# Patient Record
Sex: Male | Born: 1935 | Race: White | Hispanic: No | Marital: Married | Smoking: Former smoker
Health system: Southern US, Community
[De-identification: ages and names within clinical notes are randomized; demographics above are authoritative.]

## PROBLEM LIST (undated history)

## (undated) DIAGNOSIS — C4490 Unspecified malignant neoplasm of skin, unspecified: Secondary | ICD-10-CM

## (undated) DIAGNOSIS — J449 Chronic obstructive pulmonary disease, unspecified: Secondary | ICD-10-CM

## (undated) DIAGNOSIS — J4489 Other specified chronic obstructive pulmonary disease: Secondary | ICD-10-CM

## (undated) DIAGNOSIS — E785 Hyperlipidemia, unspecified: Secondary | ICD-10-CM

## (undated) DIAGNOSIS — I2510 Atherosclerotic heart disease of native coronary artery without angina pectoris: Secondary | ICD-10-CM

## (undated) DIAGNOSIS — I519 Heart disease, unspecified: Secondary | ICD-10-CM

## (undated) DIAGNOSIS — I714 Abdominal aortic aneurysm, without rupture: Secondary | ICD-10-CM

## (undated) DIAGNOSIS — H3530 Unspecified macular degeneration: Secondary | ICD-10-CM

## (undated) DIAGNOSIS — F0280 Dementia in other diseases classified elsewhere without behavioral disturbance: Secondary | ICD-10-CM

## (undated) DIAGNOSIS — E119 Type 2 diabetes mellitus without complications: Secondary | ICD-10-CM

## (undated) DIAGNOSIS — G309 Alzheimer's disease, unspecified: Secondary | ICD-10-CM

## (undated) DIAGNOSIS — N400 Enlarged prostate without lower urinary tract symptoms: Secondary | ICD-10-CM

## (undated) DIAGNOSIS — I10 Essential (primary) hypertension: Secondary | ICD-10-CM

## (undated) HISTORY — DX: Other specified chronic obstructive pulmonary disease: J44.89

## (undated) HISTORY — DX: Dementia in other diseases classified elsewhere, unspecified severity, without behavioral disturbance, psychotic disturbance, mood disturbance, and anxiety: F02.80

## (undated) HISTORY — PX: ABDOMINAL AORTIC ANEURYSM REPAIR: SUR1152

## (undated) HISTORY — DX: Chronic obstructive pulmonary disease, unspecified: J44.9

## (undated) HISTORY — DX: Essential (primary) hypertension: I10

## (undated) HISTORY — DX: Abdominal aortic aneurysm, without rupture: I71.4

## (undated) HISTORY — DX: Unspecified malignant neoplasm of skin, unspecified: C44.90

## (undated) HISTORY — DX: Hyperlipidemia, unspecified: E78.5

## (undated) HISTORY — DX: Heart disease, unspecified: I51.9

## (undated) HISTORY — DX: Alzheimer's disease, unspecified: G30.9

## (undated) HISTORY — PX: OTHER SURGICAL HISTORY: SHX169

## (undated) HISTORY — DX: Atherosclerotic heart disease of native coronary artery without angina pectoris: I25.10

## (undated) HISTORY — DX: Type 2 diabetes mellitus without complications: E11.9

## (undated) HISTORY — DX: Benign prostatic hyperplasia without lower urinary tract symptoms: N40.0

---

## 2011-06-16 LAB — LIPID PANEL
LDL Cholesterol: 87 mg/dL
Triglycerides: 96 mg/dL (ref 40–160)

## 2011-06-16 LAB — CBC AND DIFFERENTIAL
Platelets: 141 10*3/uL — AB (ref 150–399)
WBC: 7.1 10^3/mL

## 2011-06-16 LAB — TSH: TSH: 2.1 u[IU]/mL (ref <=5.90)

## 2012-04-23 LAB — BASIC METABOLIC PANEL
BUN: 40 mg/dL — AB (ref 4–21)
Creatinine: 1.2 mg/dL (ref <=1.3)

## 2012-04-23 LAB — HEMOGLOBIN A1C: Hgb A1c MFr Bld: 7.3 % — AB (ref 4.0–6.0)

## 2012-04-23 LAB — HEPATIC FUNCTION PANEL: AST: 22 U/L (ref 14–40)

## 2012-05-10 ENCOUNTER — Encounter: Payer: Self-pay | Admitting: Family Medicine

## 2012-05-10 ENCOUNTER — Ambulatory Visit (INDEPENDENT_AMBULATORY_CARE_PROVIDER_SITE_OTHER): Payer: Medicare Other | Admitting: Family Medicine

## 2012-05-10 VITALS — BP 140/70 | Ht 66.0 in | Wt 130.0 lb

## 2012-05-10 DIAGNOSIS — I2510 Atherosclerotic heart disease of native coronary artery without angina pectoris: Secondary | ICD-10-CM

## 2012-05-10 DIAGNOSIS — N400 Enlarged prostate without lower urinary tract symptoms: Secondary | ICD-10-CM

## 2012-05-10 DIAGNOSIS — I714 Abdominal aortic aneurysm, without rupture, unspecified: Secondary | ICD-10-CM | POA: Insufficient documentation

## 2012-05-10 DIAGNOSIS — E785 Hyperlipidemia, unspecified: Secondary | ICD-10-CM

## 2012-05-10 DIAGNOSIS — J449 Chronic obstructive pulmonary disease, unspecified: Secondary | ICD-10-CM

## 2012-05-10 DIAGNOSIS — E119 Type 2 diabetes mellitus without complications: Secondary | ICD-10-CM

## 2012-05-10 DIAGNOSIS — R413 Other amnesia: Secondary | ICD-10-CM

## 2012-05-10 DIAGNOSIS — H543 Unqualified visual loss, both eyes: Secondary | ICD-10-CM

## 2012-05-10 DIAGNOSIS — R454 Irritability and anger: Secondary | ICD-10-CM

## 2012-05-10 DIAGNOSIS — I10 Essential (primary) hypertension: Secondary | ICD-10-CM

## 2012-05-10 HISTORY — DX: Abdominal aortic aneurysm, without rupture, unspecified: I71.40

## 2012-05-10 HISTORY — DX: Benign prostatic hyperplasia without lower urinary tract symptoms: N40.0

## 2012-05-10 HISTORY — DX: Essential (primary) hypertension: I10

## 2012-05-10 HISTORY — DX: Hyperlipidemia, unspecified: E78.5

## 2012-05-10 HISTORY — DX: Atherosclerotic heart disease of native coronary artery without angina pectoris: I25.10

## 2012-05-10 HISTORY — DX: Abdominal aortic aneurysm, without rupture: I71.4

## 2012-05-10 HISTORY — DX: Type 2 diabetes mellitus without complications: E11.9

## 2012-05-10 HISTORY — DX: Chronic obstructive pulmonary disease, unspecified: J44.9

## 2012-05-10 NOTE — Progress Notes (Addendum)
CC: Mark Cooley is a 78 y.o. male is here for Establish Care   Subjective: HPI:  Patient presents to establish care, accompanied by his daughter Mark Cooley.  The daughter states the main concern for this visit involved personality changes over the past weeks. This has included irritability, forgetfulness, and short-term memory loss. There has been no social or lifestyle or pharmaceutical changes that proceeded this. The patient and the daughter believe that otherwise the patient has been in his regular state of health.  The patient lives with his wife and reports independence in all activities of daily living. He manages his own medications and will not let the daughter managed any medications despite daughter's concern that he might not be taking medications as prescribed. The 2 of them deny any recent motor or sensory disturbances. He has a history of a "stroke in the eyes" but cannot give more information other than this and that he is legally blind but can still see shadows. He takes a baby aspirin a day.  History of type 2 diabetes last A1c was 7.3 in October. He taking glyburide and metformin and has no outside blood sugars to report. There has been no polyphagia polydipsia nor polyuria.  History of COPD for which she is prescribed Advair however the daughter suspicious that he might not be taking. He is prescribed oxygen but uses it rarely citing concerns that he may develop a dependence for it. He denies cough, shortness of breath, orthopnea, wheezing, nor peripheral edema.   History of coronary artery disease, he has an upcoming appointment with his cardiologist next week. He is on a beta blocker, he's on aspirin, and statin. He was catheterized sometime in the last one to 2 years but there was no intervention. He reports using nitroglycerin 7 months ago, but the daughter believes he takes it more frequently. He refuses to carry nitroglycerin glycerin in his pocket consecutive in his  bathroom. He denies chest pain exertion, chest pain at rest, diaphoresis, jaw pain.    daughter is concerned that many negative review of systems responses may not be truthful.   Review Of Systems Outlined In HPI  Past Medical History  Diagnosis Date  . Heart disease   . Hypertension   . Hyperlipidemia   . Diabetes   . Asthma with COPD   . CAD (coronary artery disease)   . Skin cancer   . Hyperlipidemia LDL goal < 70 05/10/2012  . Essential hypertension 05/10/2012  . BPH (benign prostatic hyperplasia) 05/10/2012  . Coronary artery disease 05/10/2012  . COPD (chronic obstructive pulmonary disease) 05/10/2012  . AAA (abdominal aortic aneurysm) 05/10/2012  . Type 2 diabetes mellitus 05/10/2012     History reviewed. No pertinent family history.   History  Substance Use Topics  . Smoking status: Former Smoker    Types: Cigarettes  . Smokeless tobacco: Not on file     Comment: quit 25 years ago  . Alcohol Use: No     Objective: Filed Vitals:   05/10/12 1000  BP: 140/70    General: Alert and Oriented, No Acute Distress HEENT: Pupils equal, round, reactive to light. Conjunctivae clear. Moist mucous membranes, pharynx without inflammation nor lesions.  Neck supple without palpable lymphadenopathy nor abnormal masses. Lungs: Clear to auscultation bilaterally, no wheezing/ronchi/rales.  distant breath sounds  Cardiac: Regular rate and rhythm. Normal S1/S2.  No murmurs, rubs, nor gallops.   no carotid bruits  Abdomen: soft nontender to palpation Extremities: No peripheral edema.  Strong peripheral  pulses.  Neuro: CN II-XII grossly intact, full strength/rom of all four extremities, C5/L4/S1 DTRs 2/4 bilaterally, gait normal, rapid alternating movements normal Mental Status: No depression, anxiety, nor agitation. short-term memory loss however goes into detail events that occurred years ago which are confirmed to be correct by his daughter.  Skin: Warm and dry.  Assessment &  Plan: Jaquane was seen today for establish care.  Diagnoses and associated orders for this visit:  Memory loss - MR Brain Wo Contrast; Future  Type 2 diabetes mellitus  Coronary artery disease  Essential hypertension  Irritability   daughter is concerned that his recent decline in memory may be manifestation of a stroke, will get an MRI to see if this is the case to determine if Plavix or similar antiplatelet medications warranted. They will be sending his old records to me for review to determine if he has a recent TSH, B12, lipid and renal function labs. Have asked him to carry nitroglycerin instead of leaving it at home.  Daughter tells me that there's a nurse from [place] [facility] and will be visiting his house sometime this week, it sounds like this may be a home safety eval which I think is a great idea. I encouraged the patient to allow his daughter to manage his medications and to use oxygen as prescribed, however did point out that his oxygen saturation at rest today was adequate.  Diabetes appears to be well-controlled, if blood pressure remains elevated a subsequent visit we'll consider adjusting his beta blocker.  Return in about 4 weeks (around 06/07/2012).

## 2012-05-11 ENCOUNTER — Telehealth: Payer: Self-pay | Admitting: *Deleted

## 2012-05-11 NOTE — Telephone Encounter (Signed)
PA obtained for MRi Brain w/o contrast. Auth # is [id number]. Good through 06/25/2012. Carolyn at [facility] informed.

## 2012-05-12 ENCOUNTER — Other Ambulatory Visit: Payer: Self-pay | Admitting: Family Medicine

## 2012-05-12 DIAGNOSIS — Z1389 Encounter for screening for other disorder: Secondary | ICD-10-CM

## 2012-05-15 ENCOUNTER — Ambulatory Visit (HOSPITAL_BASED_OUTPATIENT_CLINIC_OR_DEPARTMENT_OTHER)
Admission: RE | Admit: 2012-05-15 | Discharge: 2012-05-15 | Disposition: A | Discharge status: Home / Self Care | Payer: Medicare Other | Source: Ambulatory Visit | Attending: Family Medicine | Admitting: Family Medicine

## 2012-05-15 DIAGNOSIS — R413 Other amnesia: Secondary | ICD-10-CM | POA: Insufficient documentation

## 2012-05-15 DIAGNOSIS — Z1389 Encounter for screening for other disorder: Secondary | ICD-10-CM

## 2012-05-15 DIAGNOSIS — H539 Unspecified visual disturbance: Secondary | ICD-10-CM | POA: Insufficient documentation

## 2012-05-15 DIAGNOSIS — F29 Unspecified psychosis not due to a substance or known physiological condition: Secondary | ICD-10-CM | POA: Insufficient documentation

## 2012-05-17 ENCOUNTER — Telehealth: Payer: Self-pay | Admitting: Family Medicine

## 2012-05-17 DIAGNOSIS — I6509 Occlusion and stenosis of unspecified vertebral artery: Secondary | ICD-10-CM | POA: Insufficient documentation

## 2012-05-17 NOTE — Telephone Encounter (Signed)
Called and spoke with sharon, the daughter and she is aware of results

## 2012-05-17 NOTE — Telephone Encounter (Signed)
Mark Cooley, Will you please let the Sindelar family know that the MRI did not show any evidence of a stroke, this is good news.  The radiologist believes one of the arteries that supplies the left part of the brain might have some narrowing.  If this is true it could increase Mark Cooley's risk of a future stroke, I've placed an order for a vascular ultrasound to see if this will give [place] a better picture.

## 2012-05-20 ENCOUNTER — Telehealth: Payer: Self-pay

## 2012-05-20 NOTE — Telephone Encounter (Signed)
After the ultrasound is scheduled please call his daughter with all information.

## 2012-06-07 ENCOUNTER — Ambulatory Visit: Payer: Medicare Other | Admitting: Family Medicine

## 2012-06-10 ENCOUNTER — Ambulatory Visit: Payer: Medicare Other | Admitting: Family Medicine

## 2012-06-14 ENCOUNTER — Encounter: Payer: Self-pay | Admitting: Family Medicine

## 2012-06-14 DIAGNOSIS — N289 Disorder of kidney and ureter, unspecified: Secondary | ICD-10-CM | POA: Insufficient documentation

## 2012-06-14 DIAGNOSIS — Z8619 Personal history of other infectious and parasitic diseases: Secondary | ICD-10-CM | POA: Insufficient documentation

## 2012-07-01 ENCOUNTER — Encounter: Payer: Self-pay | Admitting: Family Medicine

## 2012-07-01 ENCOUNTER — Ambulatory Visit (INDEPENDENT_AMBULATORY_CARE_PROVIDER_SITE_OTHER): Payer: Medicare Other | Admitting: Family Medicine

## 2012-07-01 VITALS — BP 111/57 | HR 58 | Wt 130.0 lb

## 2012-07-01 DIAGNOSIS — J441 Chronic obstructive pulmonary disease with (acute) exacerbation: Secondary | ICD-10-CM

## 2012-07-01 DIAGNOSIS — E119 Type 2 diabetes mellitus without complications: Secondary | ICD-10-CM

## 2012-07-01 DIAGNOSIS — I10 Essential (primary) hypertension: Secondary | ICD-10-CM

## 2012-07-01 DIAGNOSIS — E785 Hyperlipidemia, unspecified: Secondary | ICD-10-CM

## 2012-07-01 DIAGNOSIS — I6509 Occlusion and stenosis of unspecified vertebral artery: Secondary | ICD-10-CM

## 2012-07-01 DIAGNOSIS — Z23 Encounter for immunization: Secondary | ICD-10-CM

## 2012-07-01 MED ORDER — FLUTICASONE-SALMETEROL 500-50 MCG/DOSE IN AEPB: 1.0000 | INHALATION_SPRAY | Freq: Two times a day (BID) | RESPIRATORY_TRACT | Status: DC

## 2012-07-01 MED ORDER — PREDNISONE 20 MG PO TABS: ORAL_TABLET | ORAL | Status: AC

## 2012-07-01 MED ORDER — AZITHROMYCIN 250 MG PO TABS: ORAL_TABLET | ORAL | Status: AC

## 2012-07-01 NOTE — Progress Notes (Signed)
CC: Mark Cooley is a 78 y.o. male is here for Hypertension   Subjective: HPI:  Patient accompanied by son-in-law here for followup. Essential hypertension: Continues on Dyazide daily with no outside blood pressures to report. Denies dizziness, headaches, chest pain, shortness of breath, orthopnea, peripheral edema, nor limb claudication. Hyperlipidemia: Continues on Crestor 10 mg daily. Tries to watch what he eats, no formal exercise program. Denies right upper quadrant pain, skin or scleral discoloration, myalgias. Left vertebral artery narrowing: Denies motor or sensory disturbances nor memory loss since being seen last. Unfortunately the ultrasound order does not like it was ever scheduled. Type 2 diabetes: No blood sugars to report. Denies hypoglycemic-like episodes. Denies polyphagia polydipsia polyuria. Denies extremity pain burning or tingling. Denies poorly healing wounds or foot lesions. COPD: Son-in-law notices that he's had a cough that seems more persistent and sounds more"wet" When compared to his chronic cough of decades. Patient reports mild shortness of breath is alleviated with albuterol. He's using albuterol at least more than 3 times a day. He confirms Advair use in the morning and evening. Still continues to be abstinent from smoking. Denies fevers, chills, confusion.    Review Of Systems Outlined In HPI  Past Medical History  Diagnosis Date  . Heart disease   . Hypertension   . Hyperlipidemia   . Diabetes   . Asthma with COPD   . CAD (coronary artery disease)   . Skin cancer   . Hyperlipidemia LDL goal < 70 05/10/2012  . Essential hypertension 05/10/2012  . BPH (benign prostatic hyperplasia) 05/10/2012  . Coronary artery disease 05/10/2012  . COPD (chronic obstructive pulmonary disease) 05/10/2012  . AAA (abdominal aortic aneurysm) 05/10/2012  . Type 2 diabetes mellitus 05/10/2012     No family history on file.   History  Substance Use Topics  . Smoking  status: Former Smoker    Types: Cigarettes  . Smokeless tobacco: Not on file     Comment: quit 25 years ago  . Alcohol Use: No     Objective: Filed Vitals:   07/01/12 0917  BP: 111/57  Pulse: 58    General: Alert and Oriented, No Acute Distress HEENT: Pupils equal, round, reactive to light. Conjunctivae clear.  External ears unremarkable, canals clear with intact TMs with appropriate landmarks.  Middle ear appears open without effusion. Pink inferior turbinates.  Moist mucous membranes, pharynx without inflammation nor lesions.  Neck supple without palpable lymphadenopathy nor abnormal masses. Lungs:  good air movement, comfortable work of breathing, moderate central rhonchi mild wheezing no rales nor signs of consolidation  Cardiac: Regular rate and rhythm. Normal S1/S2.  No murmurs, rubs, nor gallops.   Extremities: No peripheral edema.  Strong peripheral pulses.  Mental Status: No depression, anxiety, nor agitation. Skin: Warm and dry.  Assessment & Plan: Mark Cooley was seen today for hypertension.  Diagnoses and associated orders for this visit:  Essential hypertension - BASIC METABOLIC PANEL WITH GFR  Hyperlipidemia ldl goal < 70 - Lipid panel  Left vertebral artery narrowing (mri nov 2013)  Type 2 diabetes mellitus - BASIC METABOLIC PANEL WITH GFR - Microalbumin / creatinine urine ratio  Copd with exacerbation - azithromycin (ZITHROMAX) 250 MG tablet; Take two tabs at once on day 1, then one tab daily on days 2-5. - predniSONE (DELTASONE) 20 MG tablet; Three tabs at once daily for five days.  Other Orders - Tdap vaccine greater than or equal to 7yo IM - Fluticasone-Salmeterol (ADVAIR DISKUS) 500-50 MCG/DOSE AEPB; Inhale 1  puff into the lungs 2 (two) times daily.    Essential hypertension: Controlled, continue Dyazide regimen. Check electrolytes today Hyperlipidemia: Clinical is stable, it's been over year since his last LDL check therefore lipid panel today Type  2 diabetes: Clinically stable, checking creatinine to ensure appropriateness of metformin, no recent microgram and creatinine ratio. COPD: Deteriorated, I believe he is having an exacerbation therefore start prednisone and azithromycin, I like to go up on his Advair as it sounds like he has a chronic cough throughout winter Vertebral artery narrowing: Resubmitted transcranial Doppler order  Return in about 2 months (around 09/01/2012).

## 2012-07-13 ENCOUNTER — Ambulatory Visit (HOSPITAL_COMMUNITY)
Admission: RE | Admit: 2012-07-13 | Discharge: 2012-07-13 | Disposition: A | Discharge status: Home / Self Care | Payer: Medicare Other | Source: Ambulatory Visit | Attending: Family Medicine | Admitting: Family Medicine

## 2012-07-13 DIAGNOSIS — I6509 Occlusion and stenosis of unspecified vertebral artery: Secondary | ICD-10-CM | POA: Insufficient documentation

## 2012-07-13 NOTE — Progress Notes (Signed)
VASCULAR LAB  TCD completed.     Trinna Kunst, RVT 07/13/2012, 1:39 PM

## 2012-07-14 ENCOUNTER — Encounter: Payer: Self-pay | Admitting: Family Medicine

## 2012-07-16 ENCOUNTER — Telehealth: Payer: Self-pay | Admitting: Family Medicine

## 2012-07-16 MED ORDER — ZOSTER VACCINE LIVE 19400 UNT/0.65ML ~~LOC~~ SOLR: 0.6500 mL | Freq: Once | SUBCUTANEOUS | Status: DC

## 2012-08-06 ENCOUNTER — Other Ambulatory Visit: Payer: Self-pay | Admitting: *Deleted

## 2012-08-06 ENCOUNTER — Telehealth: Payer: Self-pay | Admitting: Family Medicine

## 2012-08-06 DIAGNOSIS — E119 Type 2 diabetes mellitus without complications: Secondary | ICD-10-CM

## 2012-08-06 MED ORDER — GLYBURIDE-METFORMIN 5-500 MG PO TABS: ORAL_TABLET | ORAL | Status: DC

## 2012-08-06 NOTE — Telephone Encounter (Signed)
Per patient request

## 2012-08-14 ENCOUNTER — Other Ambulatory Visit: Payer: Self-pay | Admitting: Family Medicine

## 2012-08-23 ENCOUNTER — Encounter: Payer: Self-pay | Admitting: Family Medicine

## 2012-08-23 ENCOUNTER — Ambulatory Visit (INDEPENDENT_AMBULATORY_CARE_PROVIDER_SITE_OTHER): Payer: Medicare Other | Admitting: Family Medicine

## 2012-08-23 VITALS — BP 113/64 | HR 55 | Wt 127.0 lb

## 2012-08-23 DIAGNOSIS — N289 Disorder of kidney and ureter, unspecified: Secondary | ICD-10-CM

## 2012-08-23 DIAGNOSIS — E785 Hyperlipidemia, unspecified: Secondary | ICD-10-CM

## 2012-08-23 DIAGNOSIS — E119 Type 2 diabetes mellitus without complications: Secondary | ICD-10-CM

## 2012-08-23 DIAGNOSIS — N400 Enlarged prostate without lower urinary tract symptoms: Secondary | ICD-10-CM

## 2012-08-23 DIAGNOSIS — J449 Chronic obstructive pulmonary disease, unspecified: Secondary | ICD-10-CM

## 2012-08-23 DIAGNOSIS — I10 Essential (primary) hypertension: Secondary | ICD-10-CM

## 2012-08-23 LAB — BASIC METABOLIC PANEL WITH GFR
BUN: 28 mg/dL — ABNORMAL HIGH (ref 6–23)
CO2: 27 mEq/L (ref 19–32)
Calcium: 9.3 mg/dL (ref 8.4–10.5)
Chloride: 104 mEq/L (ref 96–112)
Creat: 1.24 mg/dL (ref 0.50–1.35)
Glucose, Bld: 142 mg/dL — ABNORMAL HIGH (ref 70–99)

## 2012-08-23 LAB — LIPID PANEL
Cholesterol: 146 mg/dL (ref 0–200)
HDL: 50 mg/dL (ref >39)
Triglycerides: 76 mg/dL (ref <150)

## 2012-08-23 NOTE — Progress Notes (Signed)
CC: Mark Cooley is a 79 y.o. male is here for Hypertension and Diabetes   Subjective: HPI:  Followup type 2 diabetes: Has been taking glyburide/metformin 1 tablet daily. No outside blood sugars to report. Denies polyuria polyphasia or polydipsia. Denies poorly healing wounds or motor sensory disturbances. Denies numbness tingling or discomfort in his feet nor lesions of the feet. Denies vision loss.  Submitted blood work earlier today to check creatinine level.  Submitted urine today for microalbumin to creatinine ratio.  Followup COPD: Has been using Advair 500/50 twice a day. Using albuterol twice a day usually right before physical exertion to prevent symptoms. Denies awakening with the need to use albuterol since seen last. He feels that his breathing is much better since increasing his Advair regimen last visit. He denies cough, wheezing, shortness of breath, chest tightness, orthopnea.  Followup essential hypertension: Has been taking hydrochlorothiazide and metoprolol. No outside blood pressures to report. Denies headache, confusion, motor sensory disturbances, chest pain, irregular heartbeat, falls, fatigue, nor limb claudication.  Followup hyperlipidemia: Has been taking Crestor once a day. He has waited to submit fasting lipid panel until today. Denies right upper quadrant pain, skin or scleral discoloration, nor myalgias.  Reports one day of urinary hesitancy and the need to strain to urinate. This happened last week and was present for no more than 24 hours. He has a history of BPH and a surgical procedure on the prostate which she has trouble describing. Since this episode he denies urgency, dysuria, sensation of incomplete voiding, straining to urinate, nor waking more than once at night to urinate.   Review Of Systems Outlined In HPI  Past Medical History  Diagnosis Date  . Heart disease   . Hypertension   . Hyperlipidemia   . Diabetes   . Asthma with COPD   . CAD  (coronary artery disease)   . Skin cancer   . Hyperlipidemia LDL goal < 70 05/10/2012  . Essential hypertension 05/10/2012  . BPH (benign prostatic hyperplasia) 05/10/2012  . Coronary artery disease 05/10/2012  . COPD (chronic obstructive pulmonary disease) 05/10/2012  . AAA (abdominal aortic aneurysm) 05/10/2012  . Type 2 diabetes mellitus 05/10/2012     No family history on file.   History  Substance Use Topics  . Smoking status: Former Smoker    Types: Cigarettes  . Smokeless tobacco: Not on file     Comment: quit 25 years ago  . Alcohol Use: No     Objective: Filed Vitals:   08/23/12 0943  BP: 113/64  Pulse: 55    General: Alert and Oriented, No Acute Distress HEENT: Pupils equal, round, reactive to light. Conjunctivae clear. Moist mucous membranes, pharynx without inflammation nor lesions.  Neck supple without palpable lymphadenopathy nor abnormal masses. Lungs: Clear to auscultation bilaterally, no wheezing/ronchi/rales.  Comfortable work of breathing. Good air movement. Cardiac: Regular rate and rhythm. Normal S1/S2.  No murmurs, rubs, nor gallops.   Extremities: No peripheral edema.  Strong peripheral pulses.  Mental Status: No depression, anxiety, nor agitation. Skin: Warm and dry.  Assessment & Plan: Mark Cooley was seen today for hypertension and diabetes.  Diagnoses and associated orders for this visit:  Diabetes - POCT HgB A1C - glyBURIDE-metformin (GLUCOVANCE) 5-500 MG per tablet; Take one by mouth daily.  Type 2 diabetes mellitus  COPD (chronic obstructive pulmonary disease)  Essential hypertension  Hyperlipidemia LDL goal < 70  Renal insufficiency  BPH (benign prostatic hyperplasia)    Type 2 diabetes: A1c of 8.6,  uncontrolled, we'll need to adjust his medications however I would like to check his creatinine level before changing his metformin regimen. He has a history of renal insufficiency. Goal A1c of 7-8. Awaiting results from urine studies  today Essential hypertension: Controlled, given frequent normotensive pressures I like to see if he can stand being off of hydrochlorothiazide. His daughter will keep a blood pressure diary and to return to 3 weeks. COPD: Stable, continue Advair regimen. Hyperlipidemia: Clinically stable however awaiting fasting lipid panel from today BPH: I encouraged him and his family to stop taking Tylenol PM as this may exacerbate urinary symptoms. His symptoms appear to be stable today however will consider Flomax in the future if needed.  awiating creatinine for DM titration  Return in about 3 weeks (around 09/13/2012).

## 2012-08-24 ENCOUNTER — Telehealth: Payer: Self-pay | Admitting: Family Medicine

## 2012-08-24 DIAGNOSIS — E119 Type 2 diabetes mellitus without complications: Secondary | ICD-10-CM

## 2012-08-24 DIAGNOSIS — E785 Hyperlipidemia, unspecified: Secondary | ICD-10-CM

## 2012-08-24 LAB — MICROALBUMIN / CREATININE URINE RATIO: Microalb, Ur: 0.68 mg/dL (ref 0.00–1.89)

## 2012-08-24 MED ORDER — GLYBURIDE-METFORMIN 5-500 MG PO TABS: ORAL_TABLET | ORAL | Status: DC

## 2012-08-24 MED ORDER — ROSUVASTATIN CALCIUM 20 MG PO TABS: 20.0000 mg | ORAL_TABLET | Freq: Every day | ORAL | Status: DC

## 2012-08-24 NOTE — Telephone Encounter (Signed)
Sue Lush, Will you please let the [name] family know that Mark Cooley's lab work from yesterday shows that his sugar control and cholesterol are not at goal.  I've slightly increased his rosuvastatin/crestor and glyburide-metformin regimen and sent updated Rxs to [facility] [place].  He can still follow up in 3-4 weeks to assess his blood pressure.

## 2012-08-25 NOTE — Telephone Encounter (Signed)
Pt's daughter notified.

## 2012-09-08 ENCOUNTER — Other Ambulatory Visit: Payer: Self-pay | Admitting: Family Medicine

## 2012-09-13 ENCOUNTER — Encounter: Payer: Self-pay | Admitting: Family Medicine

## 2012-09-13 ENCOUNTER — Ambulatory Visit (INDEPENDENT_AMBULATORY_CARE_PROVIDER_SITE_OTHER): Payer: Medicare Other | Admitting: Family Medicine

## 2012-09-13 VITALS — BP 130/77 | HR 58 | Wt 131.0 lb

## 2012-09-13 DIAGNOSIS — Z8619 Personal history of other infectious and parasitic diseases: Secondary | ICD-10-CM

## 2012-09-13 NOTE — Progress Notes (Signed)
CC: Mark Cooley is a 79 y.o. male is here for Hypertension   Subjective: HPI:  Followup essential hypertension: We stopped hydrochlorothiazide at his last visit. No outside blood pressures to report. He denies headaches, vision change, dizziness, chest pain, shortness of breath, orthopnea, peripheral edema, nor irregular heartbeat.  History of shingles: At a recent visit he was given a prescription for zoster vaccine. He is refusing to have this done due to the price of $100 at his pharmacy. He has many questions about whether or not he should take his vaccine.  History BPH: At his last visit he was  Describing nocturnal urinary symptoms  and I encouraged him to stop taking Advil PM do to concern of urinary retention. He has since stopped and reports no longer having to wait more than once a night to urinate. He denies straining to urinate, dribbling, nor decreased flow of stream.  Hyperlipidemia: He would like to go over his most recent lipid panel. Since increasing Crestor he denies right upper quadrant pain, myalgias, skin or scleral discoloration.    Review Of Systems Outlined In HPI  Past Medical History  Diagnosis Date  . Heart disease   . Hypertension   . Hyperlipidemia   . Diabetes   . Asthma with COPD   . CAD (coronary artery disease)   . Skin cancer   . Hyperlipidemia LDL goal < 70 05/10/2012  . Essential hypertension 05/10/2012  . BPH (benign prostatic hyperplasia) 05/10/2012  . Coronary artery disease 05/10/2012  . COPD (chronic obstructive pulmonary disease) 05/10/2012  . AAA (abdominal aortic aneurysm) 05/10/2012  . Type 2 diabetes mellitus 05/10/2012     No family history on file.   History  Substance Use Topics  . Smoking status: Former Smoker    Types: Cigarettes  . Smokeless tobacco: Not on file     Comment: quit 25 years ago  . Alcohol Use: No     Objective: Filed Vitals:   09/13/12 0959  BP: 130/77  Pulse: 58    General: Alert and Oriented, No  Acute Distress HEENT: Pupils equal, round, reactive to light. Conjunctivae clear.  Moist mucous membranes, pharynx without inflammation nor lesions.  Neck supple without palpable lymphadenopathy nor abnormal masses. Lungs: Clear to auscultation bilaterally, no wheezing/ronchi/rales.  Comfortable work of breathing. Good air movement. Cardiac: Regular rate and rhythm. Normal S1/S2.  No murmurs, rubs, nor gallops.   Abdomen: Normal bowel sounds, soft and non tender without palpable masses. Extremities: No peripheral edema.  Strong peripheral pulses.  Mental Status: No depression, anxiety, nor agitation. Skin: Warm and dry.  Assessment & Plan: Mark Cooley was seen today for hypertension.  Diagnoses and associated orders for this visit:  Essential hypertension  Hyperlipidemia LDL goal < 70  History of shingles  BPH (benign prostatic hyperplasia)  Other Orders - MELATONIN PO; Take by mouth.    Essential hypertension: Controlled, continue to hold off on hydrochlorothiazide. Continue metoprolol Hyperlipidemia: Clinically stable, continue increased dose of Crestor and will recheck in 2-3 months. All questions were answered regarding interpretation of different values of recent lipid panel. BPH: Improved, continue to avoid anticholinergics such as diphenhydramine History of shingles: Discussed risks and benefits of getting a shingles vaccine, strongly encouraged him to have this vaccine filled despite price.  25 minutes spent face-to-face during visit today of which at least 50% was counseling or coordinating care regarding BPH, essential hypertension, history of shingles, hyperlipidemia.   Return in about 2 months (around 11/13/2012).

## 2012-10-14 ENCOUNTER — Other Ambulatory Visit: Payer: Self-pay | Admitting: *Deleted

## 2012-10-14 DIAGNOSIS — E119 Type 2 diabetes mellitus without complications: Secondary | ICD-10-CM

## 2012-10-14 MED ORDER — GLYBURIDE-METFORMIN 5-500 MG PO TABS: ORAL_TABLET | ORAL | Status: DC

## 2012-10-14 MED ORDER — METOPROLOL SUCCINATE ER 25 MG PO TB24: 25.0000 mg | ORAL_TABLET | Freq: Every day | ORAL | Status: DC

## 2012-10-14 NOTE — Progress Notes (Signed)
Sent refills to [facility], but received request from [facility]. Cancelled rx's [facility] by phone and then sent refills to  [facility]

## 2012-10-29 ENCOUNTER — Telehealth: Payer: Self-pay | Admitting: *Deleted

## 2012-10-29 ENCOUNTER — Encounter: Payer: Self-pay | Admitting: Family Medicine

## 2012-10-29 ENCOUNTER — Ambulatory Visit (INDEPENDENT_AMBULATORY_CARE_PROVIDER_SITE_OTHER): Payer: Medicare Other | Admitting: Family Medicine

## 2012-10-29 VITALS — BP 130/65 | HR 53 | Wt 129.0 lb

## 2012-10-29 DIAGNOSIS — J449 Chronic obstructive pulmonary disease, unspecified: Secondary | ICD-10-CM

## 2012-10-29 DIAGNOSIS — R413 Other amnesia: Secondary | ICD-10-CM

## 2012-10-29 DIAGNOSIS — IMO0002 Reserved for concepts with insufficient information to code with codable children: Secondary | ICD-10-CM

## 2012-10-29 DIAGNOSIS — T148XXA Other injury of unspecified body region, initial encounter: Secondary | ICD-10-CM

## 2012-10-29 MED ORDER — AMBULATORY NON FORMULARY MEDICATION: Status: AC

## 2012-10-29 NOTE — Telephone Encounter (Signed)
Called [facility] and asked if they ever service or check portable nebulizers and they dont. The rep states he would just need to get a new machine and the price would be around $60. Order printed for home oxygen

## 2012-10-29 NOTE — Progress Notes (Signed)
CC: Mark Cooley is a 79 y.o. male is here for concerned about memory, fell in the parking lot and wants referral for oxygen   Subjective: HPI:  Patient accompanied by 2 daughters and wife for family concerns of memory trouble. They described this as a new difficulty with spatial orientation giving an example of his  Inability to comprehend how a small volume of multiple fit in the back it is empty pickup truck.  He is also having trouble putting things back together at home. This occurred about a week and a half ago and came on abruptly. It was accompanied by increased fatigue and irritability in addition to worsening short-term memory and fixating on events that occurred well in the past but been under the impression that it had occurred quite recently.  Family believed that this was due to melatonin so stopped melatonin and restarted Tylenol PM however symptoms have not changed to any degree for better or worse. No other medication changes recently. Patient does not believe that there's been any change in cognition. He is completely independent in all activities of daily living. He is off all since I saw him last but was in the parking lot before he came in where he stumbled getting out of car.  He is prescribed oxygen to be worn during the daytime and nighttime but only wears it at night.  Patient denies fevers, chills, shortness of breath, pain, abdominal pain, back pain, muscle or skeletal pain, dysuria, urinary preprocedure, nausea, diarrhea, constipation, motor or sensory disturbances, chest pain, vision trouble, dizziness, nor worsening hearing loss  He fell on his left knee in our parking month and denies pain but does feel like it might be bleeding. Family witnessed the fall and he did not hit his head or any other appendage  Family mentions that they're having trouble managing his current oxygen regimen and have requested home feel oxygen through their medical supply company.  Family  believes that the portable nebulizer is out of date and not functioning properly. They are unable to provide a specifics as to what may or may not be working   Review Of Systems Outlined In HPI  Past Medical History  Diagnosis Date  . Heart disease   . Hypertension   . Hyperlipidemia   . Diabetes   . Asthma with COPD   . CAD (coronary artery disease)   . Skin cancer   . Hyperlipidemia LDL goal < 70 05/10/2012  . Essential hypertension 05/10/2012  . BPH (benign prostatic hyperplasia) 05/10/2012  . Coronary artery disease 05/10/2012  . COPD (chronic obstructive pulmonary disease) 05/10/2012  . AAA (abdominal aortic aneurysm) 05/10/2012  . Type 2 diabetes mellitus 05/10/2012     Family History  Problem Relation Age of Onset  . Hypertension Father      History  Substance Use Topics  . Smoking status: Former Smoker    Types: Cigarettes  . Smokeless tobacco: Not on file     Comment: quit 25 years ago  . Alcohol Use: No     Objective: Filed Vitals:   10/29/12 1525  BP: 130/65  Pulse: 53    General: No Acute Distress HEENT: Pupils equal, round, reactive to light. Conjunctivae clear.  External ears unremarkable, canals clear with intact TMs with appropriate landmarks.  Middle ear appears open without effusion. Pink inferior turbinates.  Moist mucous membranes, pharynx without inflammation nor lesions.  Neck supple without palpable lymphadenopathy nor abnormal masses. Lungs: Distant breath sounds Clear to auscultation  bilaterally, no wheezing/ronchi/rales.  Comfortable work of breathing. Good air movement. Cardiac: Regular rate and rhythm. Normal S1/S2.  No murmurs, rubs, nor gallops.   Abdomen: Soft nontender to palpation Neuro: CN II-XII grossly intact, full strength/rom of all four extremities, C5/L4/S1 DTRs 2/4 bilaterally, gait normal, rapid alternating movements normal, heel-shin test normal, Rhomberg normal. Patient is disoriented to time (unknown day, unknown month, unknown  year, correctly identifies the season)  but not to place. Extremities: No peripheral edema.  Strong peripheral pulses. Superficial abrasion on the left knee with mild bleeding but hemostasis already achieved Mental Status: No depression, anxiety, nor agitation. Skin: Warm and dry.  Assessment & Plan: Mark Cooley was seen today for concerned about memory, fell in the parking lot and wants referral for oxygen.  Diagnoses and associated orders for this visit:  Memory loss - Urinalysis, Routine w reflex microscopic - Urine culture - CBC w/Diff - COMPLETE METABOLIC PANEL WITH GFR - Cancel: Ammonia - B12 - TSH - AMMONIA  Skin abrasion  COPD (chronic obstructive pulmonary disease)    Memory loss: Discussed with family no strong indication for imaging at this time given normal neuro exam however will rule out urinary tract infection another common causes of memory loss above.  If no clear source of etiology will consider neuroimaging in the near future.  Skin abrasion: I cleaned his wound with water and alcohol and placed antibiotic ointment with a Band-Aid over it, instructed the family to do the same on a daily basis and discussed signs and symptoms of infection to look for  COPD: Per family request will get in touch with his home medical supply company with order for home filled oxygen and to check the function of his nebulizer  40 minutes spent face-to-face during visit today of which at least 50% was counseling or coordinating care regarding memory loss, skin abrasion, COPD.     Return in about 1 week (around 11/05/2012).

## 2012-10-30 LAB — CBC WITH DIFFERENTIAL/PLATELET
Basophils Absolute: 0.1 10*3/uL (ref 0.0–0.1)
HCT: 37.5 % — ABNORMAL LOW (ref 39.0–52.0)
Lymphocytes Relative: 18 % (ref 12–46)
Neutro Abs: 3.4 10*3/uL (ref 1.7–7.7)
Platelets: 128 10*3/uL — ABNORMAL LOW (ref 150–400)
RDW: 14.6 % (ref 11.5–15.5)
WBC: 5.7 10*3/uL (ref 4.0–10.5)

## 2012-10-30 LAB — URINALYSIS, ROUTINE W REFLEX MICROSCOPIC
Ketones, ur: NEGATIVE mg/dL
Leukocytes, UA: NEGATIVE
Nitrite: NEGATIVE
Specific Gravity, Urine: 1.021 (ref 1.005–1.030)
pH: 6 (ref 5.0–8.0)

## 2012-10-30 LAB — COMPLETE METABOLIC PANEL WITH GFR
ALT: 11 U/L (ref 0–53)
AST: 18 U/L (ref 0–37)
Albumin: 4.2 g/dL (ref 3.5–5.2)
Calcium: 9.4 mg/dL (ref 8.4–10.5)
Chloride: 105 mEq/L (ref 96–112)
Potassium: 4.4 mEq/L (ref 3.5–5.3)
Sodium: 141 mEq/L (ref 135–145)

## 2012-10-30 LAB — AMMONIA: Ammonia: 39 umol/L (ref 16–53)

## 2012-10-30 LAB — TSH: TSH: 1.391 u[IU]/mL (ref 0.350–4.500)

## 2012-10-30 LAB — VITAMIN B12: Vitamin B-12: 356 pg/mL (ref 211–911)

## 2012-10-31 LAB — URINE CULTURE: Organism ID, Bacteria: NO GROWTH

## 2012-11-01 ENCOUNTER — Telehealth: Payer: Self-pay | Admitting: Family Medicine

## 2012-11-01 DIAGNOSIS — R413 Other amnesia: Secondary | ICD-10-CM

## 2012-11-01 MED ORDER — AMBULATORY NON FORMULARY MEDICATION: Status: AC

## 2012-11-01 NOTE — Telephone Encounter (Signed)
Faxed order for neb machine to [facility]

## 2012-11-01 NOTE — Telephone Encounter (Signed)
Mark Cooley, MRI order has been placed, thank you for the update.

## 2012-11-01 NOTE — Telephone Encounter (Signed)
Pt 's daughter notified and he does not have any metal in his body anywhere.also daughter wants a new neb sent to [facility]

## 2012-11-01 NOTE — Telephone Encounter (Signed)
Sue Lush, Will you please let the Seder family know that his blood work and urine studies did not show any clear source of his worsening memory loss.  The next step would be to get a MRI of the brain to evaluate for a small stroke.  The only reason we wouldn't be able to do this would be if he has metal implants in his body, I can't remember if this applies to him or not.  If it does not, and the family would like the MRI please let me know.

## 2012-11-02 ENCOUNTER — Telehealth: Payer: Self-pay | Admitting: *Deleted

## 2012-11-02 NOTE — Telephone Encounter (Signed)
Prior auth obtained for MRI brain W/WO contrast for pt.  Prior auth # is [id number].  Linda in Imaging at [facility] [place] notified.

## 2012-11-06 ENCOUNTER — Ambulatory Visit (HOSPITAL_BASED_OUTPATIENT_CLINIC_OR_DEPARTMENT_OTHER)
Admission: RE | Admit: 2012-11-06 | Discharge: 2012-11-06 | Disposition: A | Discharge status: Home / Self Care | Payer: Medicare Other | Source: Ambulatory Visit | Attending: Family Medicine | Admitting: Family Medicine

## 2012-11-06 ENCOUNTER — Ambulatory Visit (HOSPITAL_BASED_OUTPATIENT_CLINIC_OR_DEPARTMENT_OTHER): Payer: Medicare Other

## 2012-11-06 DIAGNOSIS — M47812 Spondylosis without myelopathy or radiculopathy, cervical region: Secondary | ICD-10-CM | POA: Insufficient documentation

## 2012-11-06 DIAGNOSIS — R413 Other amnesia: Secondary | ICD-10-CM

## 2012-11-06 DIAGNOSIS — I739 Peripheral vascular disease, unspecified: Secondary | ICD-10-CM | POA: Insufficient documentation

## 2012-11-06 MED ORDER — GADOBENATE DIMEGLUMINE 529 MG/ML IV SOLN: 15.0000 mL | Freq: Once | INTRAVENOUS | Status: AC | PRN

## 2012-11-08 ENCOUNTER — Telehealth: Payer: Self-pay | Admitting: Family Medicine

## 2012-11-08 DIAGNOSIS — R413 Other amnesia: Secondary | ICD-10-CM

## 2012-11-08 NOTE — Telephone Encounter (Signed)
Mark Cooley, Will you please let the Causer family know that Mark Cooley's MRI did not show any signs of a stroke nor brain masses.  I've placed a referral to a neurologist so they can get further input on what might be causing the memory loss.  An option before that appointment would be starting a medication called Aricept which I think the family is familiar with.  Please let me know if they're interested in this.

## 2012-11-08 NOTE — Telephone Encounter (Signed)
Left message for daughter to call back.  

## 2012-11-09 NOTE — Telephone Encounter (Signed)
Pt's daughter notified and she will talk with her sister about starting the aricept and then call us back

## 2012-11-12 NOTE — Telephone Encounter (Signed)
Pt's daughter called back and they would like to go ahead with the aricept

## 2012-11-15 MED ORDER — DONEPEZIL HCL 5 MG PO TABS: 5.0000 mg | ORAL_TABLET | Freq: Every evening | ORAL | Status: DC | PRN

## 2012-11-15 NOTE — Telephone Encounter (Signed)
Left message on vm

## 2012-11-15 NOTE — Telephone Encounter (Signed)
Mark Cooley, Will you please let the Irmen family know I sent [facility] to [facility].

## 2012-11-22 ENCOUNTER — Encounter: Payer: Self-pay | Admitting: Family Medicine

## 2012-11-22 ENCOUNTER — Ambulatory Visit (INDEPENDENT_AMBULATORY_CARE_PROVIDER_SITE_OTHER): Payer: Medicare Other | Admitting: Family Medicine

## 2012-11-22 VITALS — BP 133/76 | HR 53 | Wt 129.0 lb

## 2012-11-22 DIAGNOSIS — H3530 Unspecified macular degeneration: Secondary | ICD-10-CM

## 2012-11-22 DIAGNOSIS — R413 Other amnesia: Secondary | ICD-10-CM

## 2012-11-22 DIAGNOSIS — E785 Hyperlipidemia, unspecified: Secondary | ICD-10-CM

## 2012-11-22 DIAGNOSIS — E119 Type 2 diabetes mellitus without complications: Secondary | ICD-10-CM

## 2012-11-22 NOTE — Progress Notes (Signed)
CC: Mark Cooley is a 79 y.o. male is here for Diabetes   Subjective: HPI:  Followup memory loss: Awaiting neurology appointment, has been on Aricept for 5 days family and wife noticing improvement with short-term memory loss and irritability.  Patient doesn't seem to notice any improvement with memory loss but never subjectively sensed this loss to begin with. Denies depression, anxiety, mental disturbance.  Followup at 2 diabetes: Increased sulfonylurea and metformin at last visit. Denies polyuria polyphasia polydipsia. No outside blood sugars to report. Denies poorly healing wounds nor motor or sensory disturbances  Followup hyperlipidemia: LDL was above goal of 70 3 months ago and increased Crestor. Denies myalgias, right upper quadrant pain, skin or scleral discoloration since increased. Denies chest pain, shortness of breath, limb claudication, irregular heartbeat, orthopnea, peripheral edema    Review Of Systems Outlined In HPI  Past Medical History  Diagnosis Date  . Heart disease   . Hypertension   . Hyperlipidemia   . Diabetes   . Asthma with COPD   . CAD (coronary artery disease)   . Skin cancer   . Hyperlipidemia LDL goal < 70 05/10/2012  . Essential hypertension 05/10/2012  . BPH (benign prostatic hyperplasia) 05/10/2012  . Coronary artery disease 05/10/2012  . COPD (chronic obstructive pulmonary disease) 05/10/2012  . AAA (abdominal aortic aneurysm) 05/10/2012  . Type 2 diabetes mellitus 05/10/2012     Family History  Problem Relation Age of Onset  . Hypertension Father      History  Substance Use Topics  . Smoking status: Former Smoker    Types: Cigarettes  . Smokeless tobacco: Not on file     Comment: quit 25 years ago  . Alcohol Use: No     Objective: Filed Vitals:   11/22/12 0846  BP: 133/76  Pulse: 53    General: Alert and Oriented, No Acute Distress HEENT: Pupils equal, round, reactive to light. Conjunctivae clear.  Moist mucous  membranes Lungs: Clear to auscultation bilaterally, no wheezing/ronchi/rales.  Comfortable work of breathing. Good air movement. Cardiac: Regular rate and rhythm. Normal S1/S2.  No murmurs, rubs, nor gallops.   Abdomen: Normal bowel sounds, soft and non tender without palpable masses. Extremities: No peripheral edema.  Strong peripheral pulses.  Mental Status: No depression, anxiety, nor agitation. Skin: Warm and dry.  Assessment & Plan: Kareen was seen today for diabetes.  Diagnoses and associated orders for this visit:  Macular degeneration - Ambulatory referral to Ophthalmology  Hyperlipidemia LDL goal < 70 - Lipid panel  Memory loss  Type 2 diabetes mellitus - Hemoglobin A1c    Memory loss: Improved, continue Aricept and advised family that we would see the best effect of Aricept in the next 2-3 weeks. Type 2 diabetes: Due for A1c today, clinically controlled Hyperlipidemia: Due for lipid panel fasting, clinically controlled will adjust meds above based on results Referral to ophthalmology with Dr. fix per family request  Return in about 3 months (around 02/22/2013).

## 2012-11-24 NOTE — Telephone Encounter (Signed)
closed

## 2012-12-01 ENCOUNTER — Telehealth: Payer: Self-pay | Admitting: *Deleted

## 2012-12-01 DIAGNOSIS — N400 Enlarged prostate without lower urinary tract symptoms: Secondary | ICD-10-CM

## 2012-12-01 DIAGNOSIS — F0390 Unspecified dementia without behavioral disturbance: Secondary | ICD-10-CM

## 2012-12-01 MED ORDER — MEMANTINE HCL 28 X 5 MG & 21 X 10 MG PO TABS: ORAL_TABLET | ORAL | Status: DC

## 2012-12-01 NOTE — Telephone Encounter (Signed)
Sue Lush, Will you please let the Ambrosini family know that as we talked about this is a pretty common side effect of Aricept.  He can stop this medication immediately if the diarrhea is bothersome.  There is another option for him memory loss called Namednda.  I have a 4 week starter pack that he can try that starts with a low dose and slowly increases over four weeks.  I will leave this at the front desk with his name on it if the family is interested, if they start it call me before the pack runs out for a formal prescription.

## 2012-12-01 NOTE — Telephone Encounter (Signed)
Spoke with Mark Cooley and she will speak with her mother and call is back

## 2012-12-01 NOTE — Telephone Encounter (Signed)
Mark Cooley calls and states that the Aricept you put him on is causing diarrhea. Barry Dienes, LPN

## 2012-12-02 NOTE — Telephone Encounter (Signed)
I'll make sure the starter pack is up front waiting for the family to pickup, if he tolerates it after 2-3 weeks call for a formal Rx.

## 2012-12-02 NOTE — Telephone Encounter (Signed)
Daughter is wants to go ahead and start the other medication

## 2012-12-02 NOTE — Telephone Encounter (Signed)
Mark Cooley notified; she also wants a referral for a urologist Royston Sinner in [place] because the office he goes to is closing down

## 2012-12-03 ENCOUNTER — Encounter: Payer: Self-pay | Admitting: Family Medicine

## 2012-12-03 LAB — LIPID PANEL
Cholesterol: 128 mg/dL (ref 0–200)
HDL: 50 mg/dL (ref >39)
Triglycerides: 108 mg/dL (ref <150)

## 2012-12-03 NOTE — Telephone Encounter (Signed)
Referral placed.

## 2012-12-06 ENCOUNTER — Telehealth: Payer: Self-pay | Admitting: *Deleted

## 2012-12-06 ENCOUNTER — Telehealth: Payer: Self-pay | Admitting: Family Medicine

## 2012-12-06 DIAGNOSIS — R413 Other amnesia: Secondary | ICD-10-CM

## 2012-12-06 NOTE — Telephone Encounter (Signed)
Mark Cooley pt's daughter called and states referral was supposed to be for neurologist and not urologist. This was totally my misunderstanding as I thought she said urologist. The number for the office is [phone]. [facility]

## 2012-12-06 NOTE — Telephone Encounter (Signed)
Left detailed message on daughter's vm.

## 2012-12-06 NOTE — Telephone Encounter (Signed)
Mark Cooley, Will you please let the [name] family know that his recent blood work shows great cholesterol levels, no need to adjust his cholesterol medication.  His three month blood sugar test was 7.6, signifignatly improved from last time and now at goal.  No need to adjust diabetes medication dosage, however there might be coverage issues from his insurance for the glyburide-metformin combo pill, let me know if this is getting expensive because it would be much cheaper in the non combination formulation.

## 2012-12-06 NOTE — Telephone Encounter (Signed)
Referral placed.

## 2012-12-20 ENCOUNTER — Telehealth: Payer: Self-pay | Admitting: *Deleted

## 2012-12-20 ENCOUNTER — Ambulatory Visit: Payer: Medicare Other | Admitting: Family Medicine

## 2012-12-20 DIAGNOSIS — E119 Type 2 diabetes mellitus without complications: Secondary | ICD-10-CM

## 2012-12-20 NOTE — Telephone Encounter (Signed)
Daughter states insurance will not cover Glyburide-metformin but they will cover Glipizide-metformin. Can this medication be changed and sent to pharm?

## 2012-12-21 MED ORDER — GLIPIZIDE-METFORMIN HCL 5-500 MG PO TABS: 2.0000 | ORAL_TABLET | Freq: Every day | ORAL | Status: DC

## 2012-12-21 NOTE — Telephone Encounter (Signed)
Substitution [facility] [place].

## 2012-12-21 NOTE — Telephone Encounter (Signed)
Left message on daughters vm

## 2012-12-22 ENCOUNTER — Encounter: Payer: Self-pay | Admitting: Family Medicine

## 2012-12-22 ENCOUNTER — Ambulatory Visit (INDEPENDENT_AMBULATORY_CARE_PROVIDER_SITE_OTHER): Payer: Medicare Other | Admitting: Family Medicine

## 2012-12-22 VITALS — BP 136/79 | HR 64 | Wt 129.0 lb

## 2012-12-22 DIAGNOSIS — F0280 Dementia in other diseases classified elsewhere without behavioral disturbance: Secondary | ICD-10-CM

## 2012-12-22 DIAGNOSIS — E119 Type 2 diabetes mellitus without complications: Secondary | ICD-10-CM

## 2012-12-22 DIAGNOSIS — R413 Other amnesia: Secondary | ICD-10-CM

## 2012-12-22 DIAGNOSIS — E785 Hyperlipidemia, unspecified: Secondary | ICD-10-CM

## 2012-12-22 DIAGNOSIS — I10 Essential (primary) hypertension: Secondary | ICD-10-CM

## 2012-12-22 DIAGNOSIS — G309 Alzheimer's disease, unspecified: Secondary | ICD-10-CM | POA: Insufficient documentation

## 2012-12-22 DIAGNOSIS — J449 Chronic obstructive pulmonary disease, unspecified: Secondary | ICD-10-CM

## 2012-12-22 MED ORDER — METFORMIN HCL 500 MG PO TABS: 1000.0000 mg | ORAL_TABLET | Freq: Every day | ORAL | Status: DC

## 2012-12-22 MED ORDER — METOPROLOL SUCCINATE ER 25 MG PO TB24: 25.0000 mg | ORAL_TABLET | Freq: Every day | ORAL | Status: DC

## 2012-12-22 MED ORDER — FLUTICASONE-SALMETEROL 500-50 MCG/DOSE IN AEPB: 1.0000 | INHALATION_SPRAY | Freq: Two times a day (BID) | RESPIRATORY_TRACT | Status: DC

## 2012-12-22 MED ORDER — PAROXETINE HCL 10 MG PO TABS: ORAL_TABLET | ORAL | Status: DC

## 2012-12-22 MED ORDER — ALBUTEROL SULFATE HFA 108 (90 BASE) MCG/ACT IN AERS: 2.0000 | INHALATION_SPRAY | Freq: Four times a day (QID) | RESPIRATORY_TRACT | Status: DC | PRN

## 2012-12-22 MED ORDER — GLIPIZIDE 10 MG PO TABS: 10.0000 mg | ORAL_TABLET | Freq: Every day | ORAL | Status: DC

## 2012-12-22 MED ORDER — MEMANTINE HCL 10 MG PO TABS: 10.0000 mg | ORAL_TABLET | Freq: Two times a day (BID) | ORAL | Status: DC

## 2012-12-22 NOTE — Progress Notes (Signed)
CC: Mark Cooley is a 79 y.o. male is here for f/u medication   Subjective: HPI:  Followup memory loss: He has been on Namenda for 3 weeks there and he is unsure whether or not it's helping. Wife and daughter believe that it is helping with short-term memory and cutting down on irritability.  They report no trouble with long-term memory.  Patient denies confusion, motor sensory disturbances, motor sensory disturbances, anxiety, depression, nor sleeping difficulty.  Followup type 2 diabetes: There is some confusion on what insurance will cover and not cover. Glipizide/metformin combination with stent in yesterday, family has documentation that they would like clarified on what will actually be covered. Patient denies polyuria polyphagia polydipsia vision loss nor poorly healing wounds.  Daughter has concerns about oxygen use with COPD: Patient has been using 1-2 L of oxygen only while resting or sleeping. He spends the majority of the day out in his garden denying shortness of breath or wheezing.  He is using albuterol either inhaler or nebulizer 3-4 times a day reports good improvement of wheezing and shortness of breath on this regimen. Taking Advair inhaler one to 2 times a day when he can remember. Denies worsening cough, shortness of breath, nor any chest discomfort  Hyperlipidemia: Requesting refills on Crestor     Review Of Systems Outlined In HPI  Past Medical History  Diagnosis Date  . Heart disease   . Hypertension   . Hyperlipidemia   . Diabetes   . Asthma with COPD   . CAD (coronary artery disease)   . Skin cancer   . Hyperlipidemia LDL goal < 70 05/10/2012  . Essential hypertension 05/10/2012  . BPH (benign prostatic hyperplasia) 05/10/2012  . Coronary artery disease 05/10/2012  . COPD (chronic obstructive pulmonary disease) 05/10/2012  . AAA (abdominal aortic aneurysm) 05/10/2012  . Type 2 diabetes mellitus 05/10/2012     Family History  Problem Relation Age of Onset   . Hypertension Father      History  Substance Use Topics  . Smoking status: Former Smoker    Types: Cigarettes  . Smokeless tobacco: Not on file     Comment: quit 25 years ago  . Alcohol Use: No     Objective: Filed Vitals:   12/22/12 0947  BP: 136/79  Pulse: 64    General: Alert and Oriented, No Acute Distress HEENT: Pupils equal, round, reactive to light. Conjunctivae clear.  External ears unremarkable, canals clear with intact TMs with appropriate landmarks.  Middle ear appears open without effusion. Pink inferior turbinates.  Moist mucous membranes, pharynx without inflammation nor lesions.  Neck supple without palpable lymphadenopathy nor abnormal masses. Lungs: Clear to auscultation bilaterally, no wheezing/ronchi/rales.  Comfortable work of breathing. Good air movement. Cardiac: Regular rate and rhythm. Normal S1/S2.  No murmurs, rubs, nor gallops.   Abdomen: Normal bowel sounds, soft and non tender without palpable masses. Extremities: No peripheral edema.  Strong peripheral pulses.  Mental Status: No depression, anxiety, nor agitation. Skin: Warm and dry.  Assessment & Plan: Mark Cooley was seen today for f/u medication.  Diagnoses and associated orders for this visit:  Memory loss  Type 2 diabetes mellitus  Essential hypertension  Hyperlipidemia LDL goal < 70  COPD (chronic obstructive pulmonary disease) - Discontinue: Fluticasone-Salmeterol (ADVAIR DISKUS) 500-50 MCG/DOSE AEPB; Inhale 1 puff into the lungs 2 (two) times daily. - albuterol (PROVENTIL HFA;VENTOLIN HFA) 108 (90 BASE) MCG/ACT inhaler; Inhale 2 puffs into the lungs every 6 (six) hours as needed for wheezing  or shortness of breath. - Fluticasone-Salmeterol (ADVAIR DISKUS) 500-50 MCG/DOSE AEPB; Inhale 1 puff into the lungs 2 (two) times daily.  Alzheimer's disease - memantine (NAMENDA) 10 MG tablet; Take 1 tablet (10 mg total) by mouth 2 (two) times daily.  Other Orders - glipiZIDE (GLUCOTROL) 10  MG tablet; Take 1 tablet (10 mg total) by mouth daily. - metFORMIN (GLUCOPHAGE) 500 MG tablet; Take 2 tablets (1,000 mg total) by mouth daily with breakfast. - metoprolol succinate (TOPROL-XL) 25 MG 24 hr tablet; Take 1 tablet (25 mg total) by mouth daily. - PARoxetine (PAXIL) 10 MG tablet; TAKE 1 TABLET BY MOUTH EVERY DAY - Cancel: Urinalysis Dipstick    Memory loss: Suspicion for Alzheimer's remains, neurology appointment next week continue Namenda  for the time being. Continue proximal itching Type 2 diabetes: Controlled, A1c at goal last visit, discussed options with family glipizide/metformin combination or individual components.. family would prefer combination COPD: Controlled, continue Advair stressed importance of taking this twice a day, continue albuterol as needed. Encouraged patient to wear oxygen 24 hours while sleeping and also while exerting himself such as in the garden Essential hypertension: Controlled, continue metoprolol  40 minutes spent face-to-face during visit today of which at least 50% was counseling or coordinating care regarding type 2 diabetes, COPD, essential hypertension, memory loss.    Return in about 2 months (around 02/21/2013).

## 2012-12-29 ENCOUNTER — Encounter: Payer: Self-pay | Admitting: Family Medicine

## 2013-01-27 ENCOUNTER — Encounter: Payer: Self-pay | Admitting: Family Medicine

## 2013-02-22 ENCOUNTER — Ambulatory Visit (INDEPENDENT_AMBULATORY_CARE_PROVIDER_SITE_OTHER): Payer: Medicare Other | Admitting: Family Medicine

## 2013-02-22 ENCOUNTER — Encounter: Payer: Self-pay | Admitting: Family Medicine

## 2013-02-22 VITALS — BP 125/75 | HR 66 | Wt 131.0 lb

## 2013-02-22 DIAGNOSIS — Z639 Problem related to primary support group, unspecified: Secondary | ICD-10-CM

## 2013-02-22 DIAGNOSIS — Z638 Other specified problems related to primary support group: Secondary | ICD-10-CM

## 2013-02-22 DIAGNOSIS — E119 Type 2 diabetes mellitus without complications: Secondary | ICD-10-CM

## 2013-02-22 LAB — POCT GLYCOSYLATED HEMOGLOBIN (HGB A1C): Hemoglobin A1C: 8.8

## 2013-02-22 MED ORDER — GLIPIZIDE-METFORMIN HCL 5-500 MG PO TABS: ORAL_TABLET | ORAL | Status: DC

## 2013-02-22 NOTE — Progress Notes (Signed)
CC: Mark Cooley is a 79 y.o. male is here for Diabetes   Subjective: HPI:  Patient presents with daughters  Patient reports blood sugars at home ranging from 200-230 seems to be worse with ice cream improves with physical activity. Family is helping with managing medications. Continues on glipizide-metformin on a daily basis. Denies any vision loss or motor or sensory disturbances chest pain or shortness of breath  Family is concerned about recent diagnosis of Alzheimer's disease. He has been started on Exelon patches family has noticed significant improvement with improved restlessness and agitation. Patient seems to be complicating the dynamic between daughters and mother/wife, the elders of the family are starting to feel like the daughters are taking away the independence of the patient and his wife. Daughters are managing most instrumental activities of daily living. Wife is now helping with dressing but no other ADLs of the patient. There been no falls or close calls he is frequently getting his tractor stuck or lost on their property. Patient is extremely fearful that the family is about to place him in a assisted living facility. [facility] is hesit[facility] to [facility] as he fears that they are actually assisted living facilities.    Review Of Systems Outlined In HPI  Past Medical History  Diagnosis Date  . Heart disease   . Hypertension   . Hyperlipidemia   . Diabetes   . Asthma with COPD   . CAD (coronary artery disease)   . Skin cancer   . Hyperlipidemia LDL goal < 70 05/10/2012  . Essential hypertension 05/10/2012  . BPH (benign prostatic hyperplasia) 05/10/2012  . Coronary artery disease 05/10/2012  . COPD (chronic obstructive pulmonary disease) 05/10/2012  . AAA (abdominal aortic aneurysm) 05/10/2012  . Type 2 diabetes mellitus 05/10/2012     Family History  Problem Relation Age of Onset  . Hypertension Father      History  Substance Use Topics  .  Smoking status: Former Smoker    Types: Cigarettes  . Smokeless tobacco: Not on file     Comment: quit 25 years ago  . Alcohol Use: No     Objective: Filed Vitals:   02/22/13 0957  BP: 125/75  Pulse: 66    Vital signs reviewed. General: Alert oriented to place disoriented to time, No Acute Distress HEENT: Pupils equal, round, reactive to light. Conjunctivae clear.  External ears unremarkable.  Moist mucous membranes. Lungs: Clear and comfortable work of breathing, speaking in full sentences without accessory muscle use. Cardiac: Regular rate and rhythm.  Neuro: CN II-XII grossly intact, gait normal. Extremities: No peripheral edema.  Strong peripheral pulses.  Mental Status: No depression, anxiety, nor agitation. Frequently answers questions inappropriately Skin: Warm and dry.  Assessment & Plan: Bryant was seen today for diabetes.  Diagnoses and associated orders for this visit:  Type 2 diabetes mellitus - POCT HgB A1C - Microalbumin / creatinine urine ratio - glipiZIDE-metformin (METAGLIP) 5-500 MG per tablet; Two by mouth every morning and one by mouth every evening  Family conflict    Type 2 diabetes: Discuss with family increasing glipizide and metformin combination to maximum dose, unable to obtain urine micron urine however A1c of 8.8 requires the above adjustment Family conflict: Similar to discussions that I had with daughters and wife of the patient today I believe there is some misperceptions from the elders of the family that daughters are trying to take away the family's independence by helping out with instrumental activities of  daily living. I tried my best to help the patient understand that with the daughters are acting out of love and trying to help keep the patient and his wife at home for as long as possible. I strongly encouraged [facility] activities on a regular basis along with no longer allowing the patient to drive his tractor/lawnmower. He did not  believe patient would benefit from counseling given his mental capacity  40 minutes spent face-to-face during visit today of which at least 50% was counseling or coordinating care regarding type 2 diabetes, family conflict.   Return in about 4 weeks (around 03/22/2013).

## 2013-02-24 LAB — MICROALBUMIN / CREATININE URINE RATIO: Creatinine, Urine: 108.8 mg/dL

## 2013-03-21 ENCOUNTER — Encounter: Payer: Self-pay | Admitting: Family Medicine

## 2013-04-25 ENCOUNTER — Ambulatory Visit: Payer: Medicare Other | Admitting: Family Medicine

## 2013-04-27 ENCOUNTER — Encounter: Payer: Medicare Other | Admitting: Family Medicine

## 2013-05-04 ENCOUNTER — Encounter: Payer: Self-pay | Admitting: Family Medicine

## 2013-05-04 ENCOUNTER — Ambulatory Visit (INDEPENDENT_AMBULATORY_CARE_PROVIDER_SITE_OTHER): Payer: Medicare Other | Admitting: Family Medicine

## 2013-05-04 VITALS — BP 113/66 | HR 69 | Ht 66.0 in | Wt 132.0 lb

## 2013-05-04 DIAGNOSIS — J441 Chronic obstructive pulmonary disease with (acute) exacerbation: Secondary | ICD-10-CM

## 2013-05-04 DIAGNOSIS — Z0000 Encounter for general adult medical examination without abnormal findings: Secondary | ICD-10-CM

## 2013-05-04 DIAGNOSIS — Z23 Encounter for immunization: Secondary | ICD-10-CM

## 2013-05-04 DIAGNOSIS — F0280 Dementia in other diseases classified elsewhere without behavioral disturbance: Secondary | ICD-10-CM

## 2013-05-04 DIAGNOSIS — Z87891 Personal history of nicotine dependence: Secondary | ICD-10-CM

## 2013-05-04 MED ORDER — MEMANTINE HCL 10 MG PO TABS: 10.0000 mg | ORAL_TABLET | Freq: Two times a day (BID) | ORAL | Status: DC

## 2013-05-04 MED ORDER — PREDNISONE 50 MG PO TABS: ORAL_TABLET | ORAL | Status: DC

## 2013-05-04 NOTE — Progress Notes (Signed)
Subjective:    Mark Cooley is a 79 y.o. male who presents for Medicare Annual/Subsequent preventive examination.   Preventive Screening-Counseling & Management  Tobacco History  Smoking status  . Former Smoker  . Types: Cigarettes  Smokeless tobacco  . Not on file    Comment: quit 25 years ago   Colonoscopy: Beyond screening age Prostate: Discussed screening risks/beneifts with patient on 05/04/2013 beyond screening age  AAA Screen: UTD seeing [facility] every 6 months  Influenza Vaccine: UTD this year Pneumovax: Due for both prevnar and pneumovax Td/Tdap: UTD as of 2013 Zoster: received 2014    Problems Prior to Visit 1.   Current Problems (verified) Patient Active Problem List   Diagnosis Date Noted  . Alzheimer's disease 12/22/2012  . Memory loss 11/22/2012  . History of shingles 06/14/2012  . Renal insufficiency 06/14/2012  . Left Vertebral artery narrowing (MRI Nov 2013, Doppler did not confirm.) 05/17/2012  . Type 2 diabetes mellitus 05/10/2012  . AAA (abdominal aortic aneurysm) 05/10/2012  . COPD (chronic obstructive pulmonary disease) 05/10/2012  . Coronary artery disease 05/10/2012  . BPH (benign prostatic hyperplasia) 05/10/2012  . Essential hypertension 05/10/2012  . Hyperlipidemia LDL goal < 70 05/10/2012  . Irritability 05/10/2012  . Blindness of both eyes 05/10/2012    Medications Prior to Visit Current Outpatient Prescriptions on File Prior to Visit  Medication Sig Dispense Refill  . albuterol (PROVENTIL HFA;VENTOLIN HFA) 108 (90 BASE) MCG/ACT inhaler Inhale 2 puffs into the lungs every 6 (six) hours as needed for wheezing or shortness of breath.  3.7 g  11  . AMBULATORY NON FORMULARY MEDICATION Home filled oxygen tank Dx COPD 496  1 each  0  . AMBULATORY NON FORMULARY MEDICATION Nebulizer machine  ZO:XWRU 496  1 each  0  . aspirin 81 MG tablet Take 81 mg by mouth daily.      . Calcium Carb-Cholecalciferol (CALCIUM + D3) 600-200 MG-UNIT  TABS Take by mouth daily.      . diphenhydramine-acetaminophen (TYLENOL PM) 25-500 MG TABS Take 1 tablet by mouth at bedtime as needed.      . fluoruracil (CARAC) 0.5 % cream Apply topically daily.      . Fluticasone-Salmeterol (ADVAIR DISKUS) 500-50 MCG/DOSE AEPB Inhale 1 puff into the lungs 2 (two) times daily.  60 each  3  . glipiZIDE (GLUCOTROL) 10 MG tablet Take 1 tablet (10 mg total) by mouth daily.  90 tablet  1  . glipiZIDE-metformin (METAGLIP) 5-500 MG per tablet Two by mouth every morning and one by mouth every evening  180 tablet  3  . Glucose Blood (TRUETRACK TEST VI)       . MELATONIN PO Take by mouth.      . metoprolol succinate (TOPROL-XL) 25 MG 24 hr tablet Take 1 tablet (25 mg total) by mouth daily.  90 tablet  3  . nitroGLYCERIN (NITROSTAT) 0.4 MG SL tablet Place 0.4 mg under the tongue. Place one tablet under the tongue every five minutes as needed      . Omega-3 Fatty Acids (FISH OIL) 1000 MG CAPS Take by mouth.      . Oxygen Permeable Lens Products SOLN by Does not apply route.      Marland Kitchen PARoxetine (PAXIL) 10 MG tablet TAKE 1 TABLET BY MOUTH EVERY DAY  90 tablet  3  . rosuvastatin (CRESTOR) 20 MG tablet Take 1 tablet (20 mg total) by mouth daily.  90 tablet  3   No current facility-administered medications on  file prior to visit.    Current Medications (verified) Current Outpatient Prescriptions  Medication Sig Dispense Refill  . albuterol (PROVENTIL HFA;VENTOLIN HFA) 108 (90 BASE) MCG/ACT inhaler Inhale 2 puffs into the lungs every 6 (six) hours as needed for wheezing or shortness of breath.  3.7 g  11  . AMBULATORY NON FORMULARY MEDICATION Home filled oxygen tank Dx COPD 496  1 each  0  . AMBULATORY NON FORMULARY MEDICATION Nebulizer machine  AV:[id number] 496  1 each  0  . aspirin 81 MG tablet Take 81 mg by mouth daily.      . Calcium Carb-Cholecalciferol (CALCIUM + D3) 600-200 MG-UNIT TABS Take by mouth daily.      . diphenhydramine-acetaminophen (TYLENOL PM) 25-500 MG  TABS Take 1 tablet by mouth at bedtime as needed.      . fluoruracil (CARAC) 0.5 % cream Apply topically daily.      . Fluticasone-Salmeterol (ADVAIR DISKUS) 500-50 MCG/DOSE AEPB Inhale 1 puff into the lungs 2 (two) times daily.  60 each  3  . glipiZIDE (GLUCOTROL) 10 MG tablet Take 1 tablet (10 mg total) by mouth daily.  90 tablet  1  . glipiZIDE-metformin (METAGLIP) 5-500 MG per tablet Two by mouth every morning and one by mouth every evening  180 tablet  3  . Glucose Blood (TRUETRACK TEST VI)       . MELATONIN PO Take by mouth.      . memantine (NAMENDA) 10 MG tablet Take 1 tablet (10 mg total) by mouth 2 (two) times daily.  180 tablet  1  . metoprolol succinate (TOPROL-XL) 25 MG 24 hr tablet Take 1 tablet (25 mg total) by mouth daily.  90 tablet  3  . nitroGLYCERIN (NITROSTAT) 0.4 MG SL tablet Place 0.4 mg under the tongue. Place one tablet under the tongue every five minutes as needed      . Omega-3 Fatty Acids (FISH OIL) 1000 MG CAPS Take by mouth.      . Oxygen Permeable Lens Products SOLN by Does not apply route.      Marland Kitchen PARoxetine (PAXIL) 10 MG tablet TAKE 1 TABLET BY MOUTH EVERY DAY  90 tablet  3  . rosuvastatin (CRESTOR) 20 MG tablet Take 1 tablet (20 mg total) by mouth daily.  90 tablet  3  . predniSONE (DELTASONE) 50 MG tablet One by mouth daily only for five days.  5 tablet  0   No current facility-administered medications for this visit.     Allergies (verified) Donepezil   PAST HISTORY  Family History Family History  Problem Relation Age of Onset  . Hypertension Father     Social History History  Substance Use Topics  . Smoking status: Former Smoker    Types: Cigarettes  . Smokeless tobacco: Not on file     Comment: quit 25 years ago  . Alcohol Use: No    Are there smokers in your home (other than you)?  No  Risk Factors Current exercise habits: The patient does not participate in regular exercise at present.  Dietary issues discussed: fruits, veggies, lean  meats   Cardiac risk factors: advanced age (older than 58 for men, 40 for women), diabetes mellitus, dyslipidemia, hypertension and sedentary lifestyle.  Depression Screen (Note: if answer to either of the following is "Yes", a more complete depression screening is indicated)   Q1: Over the past two weeks, have you felt down, depressed or hopeless? No  Q2: Over the past two weeks, have  you felt little interest or pleasure in doing things? No  Have you lost interest or pleasure in daily life? No  Do you often feel hopeless? No  Do you cry easily over simple problems? No  Activities of Daily Living In your present state of health, do you have any difficulty performing the following activities?:  Driving? Yes Managing money?  Yes Feeding yourself? No Getting from bed to chair? No Climbing a flight of stairs? No Preparing food and eating?: No Bathing or showering? No Getting dressed: No Getting to the toilet? No Using the toilet:No Moving around from place to place: No In the past year have you fallen or had a near fall?:Yes   Are you sexually active?  No  Do you have more than one partner?  No  Hearing Difficulties: Yes Do you often ask people to speak up or repeat themselves? Yes Do you experience ringing or noises in your ears? No Do you have difficulty understanding soft or whispered voices? Yes   Do you feel that you have a problem with memory? No  Do you often misplace items? Yes  Do you feel safe at home?  Yes  Cognitive Testing  Alert? Yes  Normal Appearance?Yes  Oriented to person? Yes  Place? Yes   Time? No  Recall of three objects?  Yes  Can perform simple calculations? Yes  Displays appropriate judgment?Yes  Can read the correct time from a watch face?No   Advanced Directives have been discussed with the patient? Yes   List the Names of Other Physician/Practitioners you currently use: 1.  Dr. Wardell [facility]  Indicate any recent Medical Services you may  have received from other than [facility] providers in the past year (date may be approximate).  Immunization History  Administered Date(s) Administered  . Influenza Split 04/05/2013  . Pneumococcal Conjugate 05/04/2013  . Tdap 07/01/2012  . Zoster 07/16/2012    Screening Tests Health Maintenance  Topic Date Due  . Colonoscopy  08/19/1974  . Zostavax  08/19/1984  . Pneumococcal Polysaccharide Vaccine Age 55 And Over  08/19/1989  . Influenza Vaccine  02/04/2014  . Tetanus/tdap  07/01/2022    All answers were reviewed with the patient and necessary referrals were made:  Laren Boom, DO   05/04/2013   History reviewed: allergies, current medications, past family history, past medical history, past social history, past surgical history and problem list  Review of Systems Review of Systems - General ROS: negative for - chills, fever, night sweats, weight gain or weight loss Ophthalmic ROS: negative for - decreased vision from baseline blindness Psychological ROS: negative for - anxiety or depression ENT ROS: negative for - hearing change from baseline, nasal congestion, tinnitus or allergies Hematological and Lymphatic ROS: negative for - bleeding problems, bruising or swollen lymph nodes Breast ROS: negative Respiratory ROS: No shortness of breath, or wheezing, positive for worsening cough. Cardiovascular ROS: no chest pain or dyspnea on exertion Gastrointestinal ROS: no abdominal pain, change in bowel habits, or black or bloody stools Genito-Urinary ROS: negative for - genital discharge, genital ulcers, incontinence or abnormal bleeding from genitals Musculoskeletal ROS: negative for - joint pain or muscle pain Neurological ROS: negative for - headaches or memory loss Dermatological ROS: negative for lumps, mole changes, rash and skin lesion changes   Objective:     Vision by Snellen chart: right ZOX:WRUEAV to measure, left WUJ:WJXBJY to measure Blood pressure 113/66, pulse 69,  height 5\' 6"  (1.676 m), weight 132 lb (59.875 kg). Body  mass index is 21.32 kg/(m^2).  General: No Acute Distress HEENT: Atraumatic, normocephalic, conjunctivae normal without scleral icterus.  No nasal discharge, hearing grossly intact, TMs with good landmarks bilaterally with no middle ear abnormalities, posterior pharynx clear without oral lesions. Neck: Supple, trachea midline, no cervical nor supraclavicular adenopathy. Pulmonary: Comfortable work of breathing with moderate end expiratory wheezing in all lung fields without rales or rhonchi Cardiac: Regular rate and rhythm.  No murmurs, rubs, nor gallops. No peripheral edema.  2+ peripheral pulses bilaterally. Abdomen: Bowel sounds normal.  No masses.  Non-tender without rebound.  Negative Murphy's sign. MSK: Grossly intact, no signs of weakness.  Full strength throughout upper and lower extremities.  Full ROM in upper and lower extremities.  No midline spinal tenderness. Neuro: Gait unremarkable, CN II-XII grossly intact.  C5-C6 Reflex 2/4 Bilaterally, L4 Reflex 2/4 Bilaterally.  Cerebellar function intact. Skin: No rashes. Psych: Alert and oriented to person/place.  Thought process normal. No anxiety/depression.     Assessment:     Known history of hearing loss currently using hearing aids, known blindness due to macular degeneration followed by ophthalmology. Known Alzheimer's dementia which is slightly improved since starting Exelon and Namenda. Overdue for pneumococcal and Prevnar  COPD exacerbation     Plan:     During the course of the visit the patient was educated and counseled about appropriate screening and preventive services including:    Pneumococcal vaccine   Nutrition counseling   Diet review for nutrition referral? Not indicated   Patient Instructions (the written plan) was given to the patient.  Medicare Attestation I have personally reviewed: The patient's medical and social history Their use of  alcohol, tobacco or illicit drugs Their current medications and supplements The patient's functional ability including ADLs,fall risks, home safety risks, cognitive, and hearing and visual impairment Diet and physical activities Evidence for depression or mood disorders  The patient's weight, height, BMI, and visual acuity have been recorded in the chart.  I have made referrals, counseling, and provided education to the patient based on review of the above and I have provided the patient with a written personalized care plan for preventive services.     [employer] today, encouraged to wear oxygen 24 hours a day as prescribed, prednisone burst due to degree of wheezing today.  Laren Boom, DO   05/04/2013

## 2013-05-23 ENCOUNTER — Other Ambulatory Visit: Payer: Self-pay | Admitting: *Deleted

## 2013-05-23 DIAGNOSIS — E119 Type 2 diabetes mellitus without complications: Secondary | ICD-10-CM

## 2013-05-23 MED ORDER — GLIPIZIDE-METFORMIN HCL 5-500 MG PO TABS: ORAL_TABLET | ORAL | Status: DC

## 2013-05-24 ENCOUNTER — Telehealth: Payer: Self-pay | Admitting: *Deleted

## 2013-05-24 NOTE — Telephone Encounter (Signed)
Noted, prolly needs to be seen this week some time.  He is due for an A1c.

## 2013-05-24 NOTE — Telephone Encounter (Signed)
Mark Finner, NP with [facility] called to report that the pt had a urine glucose of 4+.  Per pt-he takes metformin & glipizide regularly but doesn't check his sugar regularly.  NP also stated that pt seemed asymptomatic.  Just an FYI on Dr. Genelle Bal pt.

## 2013-05-27 ENCOUNTER — Encounter: Payer: Self-pay | Admitting: Physician Assistant

## 2013-05-27 ENCOUNTER — Ambulatory Visit (INDEPENDENT_AMBULATORY_CARE_PROVIDER_SITE_OTHER): Payer: Medicare Other | Admitting: Physician Assistant

## 2013-05-27 VITALS — BP 139/68 | HR 53 | Wt 130.0 lb

## 2013-05-27 DIAGNOSIS — J309 Allergic rhinitis, unspecified: Secondary | ICD-10-CM

## 2013-05-27 DIAGNOSIS — E119 Type 2 diabetes mellitus without complications: Secondary | ICD-10-CM

## 2013-05-27 DIAGNOSIS — H04203 Unspecified epiphora, bilateral lacrimal glands: Secondary | ICD-10-CM

## 2013-05-27 DIAGNOSIS — H04209 Unspecified epiphora, unspecified lacrimal gland: Secondary | ICD-10-CM

## 2013-05-27 LAB — POCT GLYCOSYLATED HEMOGLOBIN (HGB A1C): Hemoglobin A1C: 8

## 2013-05-27 MED ORDER — AZELASTINE HCL 0.05 % OP SOLN: 1.0000 [drp] | Freq: Two times a day (BID) | OPHTHALMIC | Status: DC

## 2013-05-27 MED ORDER — FLUTICASONE PROPIONATE 50 MCG/ACT NA SUSP: 2.0000 | Freq: Every day | NASAL | Status: AC

## 2013-05-27 NOTE — Progress Notes (Signed)
  Subjective:    Patient ID: Mark Cooley, male    DOB: 12/22/1922, 79 y.o.   MRN: [id number]  HPI Patient presents to the clinic with daughter and wife. Pt has alzhemiers. Per daughter nurse for [employer] came and did a yearly health assessment and glucose was in urine and stated to follow up with PCP. Pt denies any symptoms. Last A1C was 8.8. Pt is taking medication as directed. Denies any hypoglycemic events. Pt feels fine. Not checking glucose at home because cannot get blood on glucometer before 10 seconds is up.    Pt complains of itchy red bilateral eyes with some post nasal drip. Denies any fever, chills, sinus pressure, ST, or ear pain. Not tried anything for it. Going on for the last week.    Review of Systems     Objective:   Physical Exam  Constitutional: He appears well-developed and well-nourished.  HENT:  Head: Normocephalic and atraumatic.  Right Ear: External ear normal.  Left Ear: External ear normal.  Mouth/Throat: Oropharynx is clear and moist.  TM's clear.   Bilateral nares red and swollen.  Eyes: Right eye exhibits discharge. Left eye exhibits discharge.  Watery. conjunctiva some mild irritation.   Neck: Normal range of motion. Neck supple.  Cardiovascular: Normal rate, regular rhythm and normal heart sounds.   Pulmonary/Chest: Effort normal and breath sounds normal.  Lymphadenopathy:    He has no cervical adenopathy.  Skin: Skin is warm and dry.  Psychiatric: He has a normal mood and affect. His behavior is normal.          Assessment & Plan:  Diabetes- recheck A1C and was 8.0. Pt is doing better than previous A1C. My suggestion is to stay on same medication regimen and continue to watch diet. I will send results to PCP and see if would like to add a DPP4 to lower just a bit more. REassured pt about glucose in urine. Aware there was some protein in urine at last visit but no urgent concerns today. Over all pt doing well.   Patient's wife wants to know if  another glucometer out there that gives longer to apply blood. Will research and get back with pt. Discussed with patient I did not think there was a need to check glucose regularly at this point. If could check would start with fasting glucose weekly.  Allergic rhinitis/watery eyes- seems pt has some allergy symptoms today. I did give some optivar to use for eyes and flonase as needed to use for post nasal drip. Follow up with PCP if not improving.

## 2013-05-27 NOTE — Patient Instructions (Signed)
Sent flonase and optivar to pharmacy.

## 2013-06-22 ENCOUNTER — Encounter: Payer: Self-pay | Admitting: Family Medicine

## 2013-08-12 ENCOUNTER — Encounter: Payer: Self-pay | Admitting: Family Medicine

## 2013-08-12 ENCOUNTER — Ambulatory Visit (INDEPENDENT_AMBULATORY_CARE_PROVIDER_SITE_OTHER): Payer: Medicare Other | Admitting: Family Medicine

## 2013-08-12 VITALS — BP 112/62 | HR 67 | Wt 132.0 lb

## 2013-08-12 DIAGNOSIS — J449 Chronic obstructive pulmonary disease, unspecified: Secondary | ICD-10-CM

## 2013-08-12 NOTE — Progress Notes (Signed)
CC: Mark Cooley is a 80 y.o. male is here for Wheezing   Subjective: HPI:  Followup COPD: Patient continues to use oxygen most hours of the day at home, continues on Advair twice a day, he is unsure how often he is using his albuterol family cannot quantify frequency of use. Patient reports over the past week he has felt more short of breath with exertion has mild worsening of a productive cough.  Family has noted wheezing is more apparent. Symptoms are present all hours of the day but worse in cold weather. Patient denies chest pain, irregular heartbeat, orthopnea, peripheral edema, nor blood in sputum.  Review Of Systems Outlined In HPI  Past Medical History  Diagnosis Date  . Heart disease   . Hypertension   . Hyperlipidemia   . Diabetes   . Asthma with COPD   . CAD (coronary artery disease)   . Skin cancer   . Hyperlipidemia LDL goal < 70 05/10/2012  . Essential hypertension 05/10/2012  . BPH (benign prostatic hyperplasia) 05/10/2012  . Coronary artery disease 05/10/2012  . COPD (chronic obstructive pulmonary disease) 05/10/2012  . AAA (abdominal aortic aneurysm) 05/10/2012  . Type 2 diabetes mellitus 05/10/2012    Past Surgical History  Procedure Laterality Date  . Eye transplant    . Remove gallstones     Family History  Problem Relation Age of Onset  . Hypertension Father     History   Social History  . Marital Status: Married    Spouse Name: N/A    Number of Children: N/A  . Years of Education: N/A   Occupational History  . Not on file.   Social History Main Topics  . Smoking status: Former Smoker    Types: Cigarettes  . Smokeless tobacco: Not on file     Comment: quit 25 years ago  . Alcohol Use: No  . Drug Use: Not on file  . Sexual Activity: Not on file   Other Topics Concern  . Not on file   Social History Narrative  . No narrative on file     Objective: BP 112/62  Pulse 67  Wt 132 lb (59.875 kg)  SpO2 97%  General: Alert and Oriented, No  Acute Distress HEENT: Pupils equal, round, reactive to light. Conjunctivae clear.  External ears unremarkable, canals clear with intact TMs with appropriate landmarks.  Middle ear appears open without effusion. Pink inferior turbinates.  Moist mucous membranes, pharynx without inflammation nor lesions.  Neck supple without palpable lymphadenopathy nor abnormal masses. Lungs: Distant breath sounds in all lung fields there is trace wheezing in all lung fields with trace rhonchi in all lung fields without rales or signs of consolidation. Comfortable work of breathing Cardiac: Regular rate and rhythm. Normal S1/S2.  No murmurs, rubs, nor gallops.   Extremities: No peripheral edema.  Strong peripheral pulses.  Mental Status: No depression, anxiety, nor agitation. Skin: Warm and dry.  Assessment & Plan: Mark Cooley was seen today for wheezing.  Diagnoses and associated orders for this visit:  COPD (chronic obstructive pulmonary disease)    COPD: Uncontrolled continue on Advair and as needed albuterol we will add Spiriva, time was taken to instruct the patient and more importantly his family how to use the Respimat inhaler once a day. Samples were given for the next month I asked him to call if they feel that this is improving his SOB/wheezing/breathing in the next one to 2 weeks for a formal prescription  25 minutes spent  face-to-face during visit today of which at least 50% was counseling or coordinating care regarding: 1. COPD (chronic obstructive pulmonary disease)      Return in about 3 months (around 11/09/2013).

## 2013-08-31 ENCOUNTER — Other Ambulatory Visit: Payer: Self-pay | Admitting: Family Medicine

## 2013-11-09 ENCOUNTER — Encounter: Payer: Self-pay | Admitting: Family Medicine

## 2013-11-09 ENCOUNTER — Ambulatory Visit (INDEPENDENT_AMBULATORY_CARE_PROVIDER_SITE_OTHER): Payer: Medicare Other | Admitting: Family Medicine

## 2013-11-09 VITALS — BP 110/61 | HR 65 | Wt 128.0 lb

## 2013-11-09 DIAGNOSIS — I10 Essential (primary) hypertension: Secondary | ICD-10-CM

## 2013-11-09 DIAGNOSIS — E119 Type 2 diabetes mellitus without complications: Secondary | ICD-10-CM

## 2013-11-09 DIAGNOSIS — E785 Hyperlipidemia, unspecified: Secondary | ICD-10-CM

## 2013-11-09 NOTE — Progress Notes (Signed)
CC: Mark Cooley is a 80 y.o. male is here for Dizziness   Subjective: HPI:  Patient complains of dizziness that has been present for the past week almost daily basis occurring one to 2 times a day. When it occurs it only happens when he ascends in a vertical direction. Symptoms have only occur when going from a seated to a standing position suddenly. Described only as dizziness moderate in severity improves greatly after sitting back down within a matter of seconds. No falls since I saw him last. No symptoms when at rest, while ambulating, nor when turning over in the bed. Denies any other motor or sensory disturbances. Denies nasal congestion, ear pain, worsening hearing loss, worsening vision loss, headache.  Type 2 diabetes: Continues on glipizide and metformin without outside blood sugars to report. Denies polyuria polyphasia or polydipsia  Hyperlipidemia: Continues on Crestor 20 mg on a daily basis without myalgias nor right upper quadrant pain. No formal exercise routine but sticks to low saturated fat diet  Review Of Systems Outlined In HPI  Past Medical History  Diagnosis Date  . Heart disease   . Hypertension   . Hyperlipidemia   . Diabetes   . Asthma with COPD   . CAD (coronary artery disease)   . Skin cancer   . Hyperlipidemia LDL goal < 70 05/10/2012  . Essential hypertension 05/10/2012  . BPH (benign prostatic hyperplasia) 05/10/2012  . Coronary artery disease 05/10/2012  . COPD (chronic obstructive pulmonary disease) 05/10/2012  . AAA (abdominal aortic aneurysm) 05/10/2012  . Type 2 diabetes mellitus 05/10/2012    Past Surgical History  Procedure Laterality Date  . Eye transplant    . Remove gallstones     Family History  Problem Relation Age of Onset  . Hypertension Father     History   Social History  . Marital Status: Married    Spouse Name: N/A    Number of Children: N/A  . Years of Education: N/A   Occupational History  . Not on file.   Social  History Main Topics  . Smoking status: Former Smoker    Types: Cigarettes  . Smokeless tobacco: Not on file     Comment: quit 25 years ago  . Alcohol Use: No  . Drug Use: Not on file  . Sexual Activity: Not on file   Other Topics Concern  . Not on file   Social History Narrative  . No narrative on file     Objective: BP 110/61  Pulse 65  Wt 128 lb (58.06 kg)  General: Alert and Oriented, No Acute Distress HEENT: Pupils equal, round, reactive to light. Conjunctivae clear.  Moist membranes pharynx unremarkable Neuro: Cranial nerves II through XII grossly intact, gait is normal Lungs: Clear to auscultation bilaterally, no wheezing/ronchi/rales.  Comfortable work of breathing. Good air movement. Cardiac: Regular rate and rhythm. Normal S1/S2.  No murmurs, rubs, nor gallops.   Extremities: No peripheral edema.  Strong peripheral pulses.  Mental Status: No depression, anxiety, nor agitation. Skin: Warm and dry.  Assessment & Plan: Mark Cooley was seen today for dizziness.  Diagnoses and associated orders for this visit:  Essential hypertension  Hyperlipidemia LDL goal < 70 - Lipid panel  Diabetes mellitus, type 2 - Hemoglobin A1c    Essential hypertension: Controlled however symptoms are suggestive and objective measurements here today are suggestive of orthostatic hypotension therefore stopping metoprolol due to severe fall risk. Hyperlipidemia: Checking annual lipid panel Type 2 diabetes: Clinically controlled however Overdue  for routine A1c check   Return in about 3 months (around 02/09/2014).

## 2013-11-10 LAB — LIPID PANEL
Cholesterol: 119 mg/dL (ref 0–200)
HDL: 46 mg/dL (ref >39)
LDL CALC: 52 mg/dL (ref 0–99)
Total CHOL/HDL Ratio: 2.6 Ratio
Triglycerides: 106 mg/dL (ref <150)
VLDL: 21 mg/dL (ref 0–40)

## 2013-11-11 ENCOUNTER — Telehealth: Payer: Self-pay | Admitting: Family Medicine

## 2013-11-11 DIAGNOSIS — E119 Type 2 diabetes mellitus without complications: Secondary | ICD-10-CM

## 2013-11-11 LAB — HEMOGLOBIN A1C
Hgb A1c MFr Bld: 8.7 % — ABNORMAL HIGH (ref <5.7)
Mean Plasma Glucose: 203 mg/dL — ABNORMAL HIGH (ref <117)

## 2013-11-11 MED ORDER — GLIPIZIDE-METFORMIN HCL 5-500 MG PO TABS: ORAL_TABLET | ORAL | Status: DC

## 2013-11-11 NOTE — Telephone Encounter (Signed)
Mark Cooley, Will you please let patient's family know that his A1c blood sugar test is increasing and is uncontrolled at this point.  I'd encourage him to increase his glipizide-metformin regimen to two in the morning and now two in the evening. We'll recheck this in 3 months.  New Rx was sent to the pharmacy.

## 2013-11-11 NOTE — Telephone Encounter (Signed)
Pt's daughter was notified.

## 2013-12-22 ENCOUNTER — Other Ambulatory Visit: Payer: Self-pay | Admitting: Family Medicine

## 2014-01-26 ENCOUNTER — Encounter: Payer: Self-pay | Admitting: Family Medicine

## 2014-02-11 ENCOUNTER — Emergency Department (HOSPITAL_BASED_OUTPATIENT_CLINIC_OR_DEPARTMENT_OTHER): Payer: Medicare Other

## 2014-02-11 ENCOUNTER — Emergency Department (HOSPITAL_BASED_OUTPATIENT_CLINIC_OR_DEPARTMENT_OTHER)
Admission: EM | Admit: 2014-02-11 | Discharge: 2014-02-11 | Disposition: A | Discharge status: Home / Self Care | Payer: Medicare Other | Attending: Emergency Medicine | Admitting: Emergency Medicine

## 2014-02-11 ENCOUNTER — Encounter (HOSPITAL_BASED_OUTPATIENT_CLINIC_OR_DEPARTMENT_OTHER): Payer: Self-pay | Admitting: Emergency Medicine

## 2014-02-11 DIAGNOSIS — E119 Type 2 diabetes mellitus without complications: Secondary | ICD-10-CM | POA: Insufficient documentation

## 2014-02-11 DIAGNOSIS — Y9301 Activity, walking, marching and hiking: Secondary | ICD-10-CM | POA: Insufficient documentation

## 2014-02-11 DIAGNOSIS — S298XXA Other specified injuries of thorax, initial encounter: Secondary | ICD-10-CM | POA: Diagnosis present

## 2014-02-11 DIAGNOSIS — I10 Essential (primary) hypertension: Secondary | ICD-10-CM | POA: Insufficient documentation

## 2014-02-11 DIAGNOSIS — Z87448 Personal history of other diseases of urinary system: Secondary | ICD-10-CM | POA: Insufficient documentation

## 2014-02-11 DIAGNOSIS — J441 Chronic obstructive pulmonary disease with (acute) exacerbation: Secondary | ICD-10-CM | POA: Diagnosis not present

## 2014-02-11 DIAGNOSIS — S20219A Contusion of unspecified front wall of thorax, initial encounter: Secondary | ICD-10-CM | POA: Diagnosis not present

## 2014-02-11 DIAGNOSIS — Z79899 Other long term (current) drug therapy: Secondary | ICD-10-CM | POA: Insufficient documentation

## 2014-02-11 DIAGNOSIS — W1809XA Striking against other object with subsequent fall, initial encounter: Secondary | ICD-10-CM | POA: Diagnosis not present

## 2014-02-11 DIAGNOSIS — Z85828 Personal history of other malignant neoplasm of skin: Secondary | ICD-10-CM | POA: Diagnosis not present

## 2014-02-11 DIAGNOSIS — I2510 Atherosclerotic heart disease of native coronary artery without angina pectoris: Secondary | ICD-10-CM | POA: Insufficient documentation

## 2014-02-11 DIAGNOSIS — Z87891 Personal history of nicotine dependence: Secondary | ICD-10-CM | POA: Diagnosis not present

## 2014-02-11 DIAGNOSIS — Y92009 Unspecified place in unspecified non-institutional (private) residence as the place of occurrence of the external cause: Secondary | ICD-10-CM | POA: Insufficient documentation

## 2014-02-11 DIAGNOSIS — IMO0002 Reserved for concepts with insufficient information to code with codable children: Secondary | ICD-10-CM | POA: Diagnosis not present

## 2014-02-11 DIAGNOSIS — Z7982 Long term (current) use of aspirin: Secondary | ICD-10-CM | POA: Insufficient documentation

## 2014-02-11 DIAGNOSIS — J45901 Unspecified asthma with (acute) exacerbation: Secondary | ICD-10-CM

## 2014-02-11 DIAGNOSIS — S20212A Contusion of left front wall of thorax, initial encounter: Secondary | ICD-10-CM

## 2014-02-11 MED ORDER — ALBUTEROL SULFATE (2.5 MG/3ML) 0.083% IN NEBU
2.5000 mg | INHALATION_SOLUTION | Freq: Once | RESPIRATORY_TRACT | Status: AC
  Administered 2014-02-11: 2.5 mg via RESPIRATORY_TRACT
  Filled 2014-02-11: qty 3

## 2014-02-11 MED ORDER — TRAMADOL HCL 50 MG PO TABS: 50.0000 mg | ORAL_TABLET | Freq: Four times a day (QID) | ORAL | Status: DC | PRN

## 2014-02-11 MED ORDER — TRAMADOL HCL 50 MG PO TABS
50.0000 mg | ORAL_TABLET | Freq: Once | ORAL | Status: AC
  Administered 2014-02-11: 50 mg via ORAL
  Filled 2014-02-11: qty 1

## 2014-02-11 NOTE — ED Provider Notes (Signed)
CSN: [id number]     Arrival date & time 02/11/14  1609 History  This chart was scribed for Mark Patches, MD by Mark Cooley, ED Scribe. This patient was seen in room MH02/MH02 and the patient's care was started at 6:37 PM.    Chief Complaint  Patient presents with  . Fall    Patient is a 80 y.o. male presenting with fall. The history is provided by the patient and a relative. No language interpreter was used.  Fall This is a new problem. The current episode started 12 to 24 hours ago. The problem occurs constantly. The problem has not changed since onset.Associated symptoms include chest pain. Pertinent negatives include no abdominal pain, no headaches and no shortness of breath. Nothing aggravates the symptoms. Nothing relieves the symptoms. He has tried nothing for the symptoms.    HPI Comments: Mark Cooley is a 80 y.o. male who lives at home with a history of COPD, DM, HTN, CAD, and hyperlipidemia who presents to the Emergency Department complaining of a fall that occurred this morning after he woke up.  Patient states that he heard the phone ring in his home this morning and began to walk backwards when he tripped, fell over a stool, and made impact with the stool on his left-side chest.  The patient's daughter corroborates the patient's description of the fall.  Patient denies head trauma and LOC.  Patient also complains of associated SOB currently that is normal to baseline.  He is prescribed Spiriva, albuterol,  and at-home oxygen for his COPD and is periodically compliant when he feels he needs it.  Patient states he did take his inhaler this morning.  Patient denies having implanted pacemaker.     Past Medical History  Diagnosis Date  . Heart disease   . Hypertension   . Hyperlipidemia   . Diabetes   . Asthma with COPD   . CAD (coronary artery disease)   . Skin cancer   . Hyperlipidemia LDL goal < 70 05/10/2012  . Essential hypertension 05/10/2012  . BPH (benign prostatic  hyperplasia) 05/10/2012  . Coronary artery disease 05/10/2012  . COPD (chronic obstructive pulmonary disease) 05/10/2012  . AAA (abdominal aortic aneurysm) 05/10/2012  . Type 2 diabetes mellitus 05/10/2012   Past Surgical History  Procedure Laterality Date  . Eye transplant    . Remove gallstones     Family History  Problem Relation Age of Onset  . Hypertension Father    History  Substance Use Topics  . Smoking status: Former Smoker    Types: Cigarettes  . Smokeless tobacco: Not on file     Comment: quit 25 years ago  . Alcohol Use: No    Review of Systems  Constitutional: Negative for fever, activity change, appetite change and fatigue.  HENT: Negative for congestion, facial swelling, rhinorrhea and trouble swallowing.   Eyes: Negative for photophobia and pain.  Respiratory: Negative for cough, chest tightness and shortness of breath.   Cardiovascular: Positive for chest pain. Negative for leg swelling.  Gastrointestinal: Negative for nausea, vomiting, abdominal pain, diarrhea and constipation.  Endocrine: Negative for polydipsia and polyuria.  Genitourinary: Negative for dysuria, urgency, decreased urine volume and difficulty urinating.  Musculoskeletal: Negative for back pain and gait problem.  Skin: Negative for color change, rash and wound.  Allergic/Immunologic: Negative for immunocompromised state.  Neurological: Negative for dizziness, syncope, facial asymmetry, speech difficulty, weakness, numbness and headaches.  Psychiatric/Behavioral: Negative for confusion, decreased concentration and agitation.  Allergies  Donepezil  Home Medications   Prior to Admission medications   Medication Sig Start Date End Date Taking? Authorizing Provider  albuterol (PROVENTIL HFA;VENTOLIN HFA) 108 (90 BASE) MCG/ACT inhaler Inhale 2 puffs into the lungs every 6 (six) hours as needed for wheezing or shortness of breath. 12/22/12   Mark Pacas, DO  AMBULATORY NON FORMULARY  MEDICATION Home filled oxygen tank Dx COPD 496 10/29/12   Mark Pacas, DO  AMBULATORY NON FORMULARY MEDICATION Nebulizer machine  [id number] 496 11/01/12   Mark Hommel, DO  aspirin 81 MG tablet Take 81 mg by mouth daily.    Historical Provider, MD  azelastine (OPTIVAR) 0.05 % ophthalmic solution Place 1 drop into both eyes 2 (two) times daily. 05/27/13   Mark L Breeback, PA-C  Calcium Carb-Cholecalciferol (CALCIUM + D3) 600-200 MG-UNIT TABS Take by mouth daily.    Historical Provider, MD  CRESTOR 20 MG tablet TAKE 1 TABLET (20 MG TOTAL) BY MOUTH DAILY. 08/31/13   Mark Hommel, DO  diphenhydramine-acetaminophen (TYLENOL PM) 25-500 MG TABS Take 1 tablet by mouth at bedtime as needed.    Historical Provider, MD  fluoruracil [facility]) 0.5 % cream Apply topically daily.    Historical Provider, MD  fluticasone (FLONASE) 50 MCG/ACT nasal spray Place 2 sprays into both nostrils daily. 05/27/13   Mark L Breeback, PA-C  Fluticasone-Salmeterol (ADVAIR DISKUS) 500-50 MCG/DOSE AEPB Inhale 1 puff into the lungs 2 (two) times daily. 12/22/12 12/22/13  Mark Pacas, DO  glipiZIDE-metformin (METAGLIP) 5-500 MG per tablet Two by mouth every morning and two by mouth every evening 11/11/13   Mark Hommel, DO  glipiZIDE-metformin (METAGLIP) 5-500 MG per tablet TAKE TWO TABLETS BY MOUTH EVERY MORNING AND ONE TABLET BY MOUTH EVERY EVENING    Mark Hommel, DO  Glucose Blood (TRUETRACK TEST VI)     Historical Provider, MD  NAMENDA 10 MG tablet TAKE 1 TABLET (10 MG TOTAL) BY MOUTH 2 (TWO) TIMES DAILY.    Mark Hommel, DO  nitroGLYCERIN (NITROSTAT) 0.4 MG SL tablet Place 0.4 mg under the tongue. Place one tablet under the tongue every five minutes as needed    Historical Provider, MD  Omega-3 Fatty Acids (FISH OIL) 1000 MG CAPS Take by mouth.    Historical Provider, MD  Oxygen Permeable Lens Products SOLN by Does not apply route.    Historical Provider, MD  PARoxetine (PAXIL) 10 MG tablet TAKE 1 TABLET BY MOUTH EVERY DAY 12/22/12   Mark  Hommel, DO  predniSONE (DELTASONE) 50 MG tablet One by mouth daily only for five days. 05/04/13   Mark Pacas, DO  Tiotropium Bromide Monohydrate (SPIRIVA RESPIMAT) 2.5 MCG/ACT AERS One inhalation a day. 08/12/13   Mark Pacas, DO  traMADol (ULTRAM) 50 MG tablet Take 1 tablet (50 mg total) by mouth every 6 (six) hours as needed. 02/11/14   Mark Patches, MD   BP 150/80  Pulse 79  Temp(Src) 98.3 F (36.8 C) (Oral)  Resp 18  Ht 5\' 5"  (1.651 m)  Wt 124 lb (56.246 kg)  BMI 20.63 kg/m2  SpO2 98% Physical Exam  Nursing note and vitals reviewed. Constitutional: He is oriented to person, place, and time. He appears well-developed and well-nourished. No distress.  HENT:  Head: Normocephalic and atraumatic.  Mouth/Throat: No oropharyngeal exudate.  Eyes: Pupils are equal, round, and reactive to light.  Neck: Normal range of motion. Neck supple.  Cardiovascular: Normal rate, regular rhythm and normal heart sounds.  Exam reveals no gallop and no friction rub.  No murmur heard. Pulmonary/Chest: Effort normal. No respiratory distress. He has wheezes (Scattered wheezing throughout). He has no rales.    Location of pain but not reproducable.    Abdominal: Soft. Bowel sounds are normal. He exhibits no distension and no mass. There is no tenderness. There is no rebound and no guarding.  Musculoskeletal: Normal range of motion. He exhibits no edema and no tenderness.  Neurological: He is alert and oriented to person, place, and time.  Skin: Skin is warm and dry.  Psychiatric: He has a normal mood and affect.    ED Course  Procedures (including critical care time)  DIAGNOSTIC STUDIES: Oxygen Saturation is 98% on RA, normal by my interpretation.    COORDINATION OF CARE:  6:47 PM Discussed negative imaging studies.  Discussed plan to order pain medication and breathing treatment.  Patient acknowledges and agrees with plan.     Labs Review Labs Reviewed - No data to display  Imaging Review Dg  Chest 2 View  02/11/2014   CLINICAL DATA:  Fall this morning.  Left posterior rib pain.  EXAM: CHEST  2 VIEW  COMPARISON:  None.  FINDINGS: Lungs are mildly hyperinflated. Heart size is normal. There are no focal consolidations or pleural effusions. Overlying the left lung base there is a nodular density possibly representing a prominent nipple shadow. However follow-up chest x-ray with nipple marker is recommended to exclude a parenchymal nodule. There are surgical clips in the right upper quadrant of the abdomen were and overlying the upper chest.  IMPRESSION: 1. No pulmonary edema or focal consolidation. 2. Question of left lung base nodule versus prominent nipple shadow. Follow-up PA view of the chest is recommended with nipple markers for further evaluation.   Electronically Signed   By: Shon Hale M.D.   On: 02/11/2014 17:00     EKG Interpretation None      MDM   Final diagnoses:  Chest wall contusion, left, initial encounter    Pt is a 80 y.o. male with Pmhx as above who presents with L lateral chest wall pain after a mechanical fall over a stool this morning. No head injury, no LOC, no inc SOB from chronic SOB. On PE, VSS, pt in NAD, 98% on RA. He has no reproducible chest wall pain, though has small abrasions to the area. He has wheezing thoughout. CXR w/o fx or PTX, rec f/u PA view of chest with nipple markers for possible lung nodule vs nipple shadow. Pt feeling improved after tramadol and 2.5mg  albuterol neb.  Will d/c home w/ Rx for tramadol, pt can alternatively use tylenol or ibuprofen. Discussed close f/u with his PCP Dr. Ileene Rubens, as well as return precautions for new or worsening symptoms including worsening pain, fever, cough, SOB.   I personally performed the services described in this documentation, which was scribed in my presence. The recorded information has been reviewed and is accurate.     Mark Patches, MD 02/11/14 2036

## 2014-02-11 NOTE — Discharge Instructions (Signed)

## 2014-02-11 NOTE — ED Notes (Signed)
This morning patient stood up and fell over a stool. States that he fell onto his left side and now having rib pain. Denies head injury.

## 2014-02-16 ENCOUNTER — Emergency Department (INDEPENDENT_AMBULATORY_CARE_PROVIDER_SITE_OTHER): Payer: Medicare Other

## 2014-02-16 ENCOUNTER — Encounter: Payer: Self-pay | Admitting: Emergency Medicine

## 2014-02-16 ENCOUNTER — Emergency Department
Admission: EM | Admit: 2014-02-16 | Discharge: 2014-02-16 | Disposition: A | Discharge status: Home / Self Care | Payer: Medicare Other | Source: Home / Self Care | Attending: Emergency Medicine | Admitting: Emergency Medicine

## 2014-02-16 ENCOUNTER — Ambulatory Visit: Payer: Medicare Other | Admitting: Family Medicine

## 2014-02-16 DIAGNOSIS — S20212S Contusion of left front wall of thorax, sequela: Secondary | ICD-10-CM

## 2014-02-16 DIAGNOSIS — I10 Essential (primary) hypertension: Secondary | ICD-10-CM

## 2014-02-16 DIAGNOSIS — J438 Other emphysema: Secondary | ICD-10-CM

## 2014-02-16 DIAGNOSIS — R112 Nausea with vomiting, unspecified: Secondary | ICD-10-CM

## 2014-02-16 DIAGNOSIS — D649 Anemia, unspecified: Secondary | ICD-10-CM

## 2014-02-16 LAB — POCT CBC W AUTO DIFF (K'VILLE URGENT CARE)

## 2014-02-16 MED ORDER — ONDANSETRON 4 MG PO TBDP: 4.0000 mg | ORAL_TABLET | Freq: Three times a day (TID) | ORAL | Status: DC | PRN

## 2014-02-16 NOTE — ED Provider Notes (Addendum)
CSN: [id number]     Arrival date & time 02/16/14  1125 History   First MD Initiated Contact with Patient 02/16/14 1137     Chief Complaint  Patient presents with  . Emesis   Mark Cooley woke this morning and after taking Mark medications he started vomiting. He was to follow up with Dr Ileene Rubens but due to Mark vomiting he was to late to be seen.--So he presents to [facility].     History provided mostly by Cooley, but also by patient and Cooley. Patient is a 80 y.o. male presenting with vomiting. The history is provided by the patient and the spouse.  Emesis Duration: Onset today. Associated symptoms: no abdominal pain and no diarrhea    Was seen 02/11/14 at [facility] Emergency Room, diagnosis chest contusion. (Imaging done there was chest x-ray which showed no acute abnormalities, questionable nipple shadow)  The chest injury was from an accidental fall 8/8 without any acute neurologic or acute cardiorespiratory symptoms.--He was prescribed tramadol for pain relief, and advised to followup with Dr. Ileene Rubens, Mark PCP today. Acute onset of nausea this morning while Mark Cooley helped him into a car to drive to Mark PCP appointment. Then, while in the car, going over several bumps, he feels the motion caused worsening nausea, then vomited x 3 without blood or bile. He states that now that is vomited, the nausea has completely resolved. Denies abdominal pain. No change of bowel habits.  He still has left lateral back/rib pain since Mark fall on 8/18. Tramadol helped that, but Cooley feels the tramadol caused the nausea and vomiting. He tried plain Tylenol, but that's not significantly helped Mark left rib pain.  Long-standing history of multiple medical problems including asthma/COPD and dementia. He denies any anterior chest pain or acute shortness of breath.   Past Medical History  Diagnosis Date  . Heart disease   . Hypertension   . Hyperlipidemia   . Diabetes   .  Asthma with COPD   . CAD (coronary artery disease)   . Skin cancer   . Hyperlipidemia LDL goal < 70 05/10/2012  . Essential hypertension 05/10/2012  . BPH (benign prostatic hyperplasia) 05/10/2012  . Coronary artery disease 05/10/2012  . COPD (chronic obstructive pulmonary disease) 05/10/2012  . AAA (abdominal aortic aneurysm) 05/10/2012  . Type 2 diabetes mellitus 05/10/2012   Past Surgical History  Procedure Laterality Date  . Eye transplant    . Remove gallstones     Family History  Problem Relation Age of Onset  . Hypertension Father    History  Substance Use Topics  . Smoking status: Former Smoker    Types: Cigarettes  . Smokeless tobacco: Not on file     Comment: quit 25 years ago  . Alcohol Use: No    Review of Systems  Gastrointestinal: Positive for vomiting. Negative for abdominal pain, diarrhea and blood in stool.  All other systems reviewed and are negative.  Denies any focal neurologic symptoms . Mark headache and nausea from earlier today have resolved. Allergies  Donepezil  Home Medications   Prior to Admission medications   Medication Sig Start Date End Date Taking? Authorizing Provider  albuterol (PROVENTIL HFA;VENTOLIN HFA) 108 (90 BASE) MCG/ACT inhaler Inhale 2 puffs into the lungs every 6 (six) hours as needed for wheezing or shortness of breath. 12/22/12   Marcial Pacas, DO  AMBULATORY NON FORMULARY MEDICATION Home filled oxygen tank Dx COPD 496 10/29/12   Marcial Pacas, DO  AMBULATORY NON FORMULARY MEDICATION Nebulizer machine  OY:[id number] 496 11/01/12   Sean Hommel, DO  aspirin 81 MG tablet Take 81 mg by mouth daily.    Historical Provider, MD  azelastine (OPTIVAR) 0.05 % ophthalmic solution Place 1 drop into both eyes 2 (two) times daily. 05/27/13   Jade L Breeback, PA-C  Calcium Carb-Cholecalciferol (CALCIUM + D3) 600-200 MG-UNIT TABS Take by mouth daily.    Historical Provider, MD  CRESTOR 20 MG tablet TAKE 1 TABLET (20 MG TOTAL) BY MOUTH DAILY. 08/31/13   Sean  Hommel, DO  diphenhydramine-acetaminophen (TYLENOL PM) 25-500 MG TABS Take 1 tablet by mouth at bedtime as needed.    Historical Provider, MD  fluoruracil [facility]) 0.5 % cream Apply topically daily.    Historical Provider, MD  fluticasone (FLONASE) 50 MCG/ACT nasal spray Place 2 sprays into both nostrils daily. 05/27/13   Jade L Breeback, PA-C  Fluticasone-Salmeterol (ADVAIR DISKUS) 500-50 MCG/DOSE AEPB Inhale 1 puff into the lungs 2 (two) times daily. 12/22/12 12/22/13  Marcial Pacas, DO  glipiZIDE-metformin (METAGLIP) 5-500 MG per tablet Two by mouth every morning and two by mouth every evening 11/11/13   Sean Hommel, DO  glipiZIDE-metformin (METAGLIP) 5-500 MG per tablet TAKE TWO TABLETS BY MOUTH EVERY MORNING AND ONE TABLET BY MOUTH EVERY EVENING    Sean Hommel, DO  Glucose Blood (TRUETRACK TEST VI)     Historical Provider, MD  NAMENDA 10 MG tablet TAKE 1 TABLET (10 MG TOTAL) BY MOUTH 2 (TWO) TIMES DAILY.    Sean Hommel, DO  nitroGLYCERIN (NITROSTAT) 0.4 MG SL tablet Place 0.4 mg under the tongue. Place one tablet under the tongue every five minutes as needed    Historical Provider, MD  Omega-3 Fatty Acids (FISH OIL) 1000 MG CAPS Take by mouth.    Historical Provider, MD  ondansetron (ZOFRAN-ODT) 4 MG disintegrating tablet Take 1 tablet (4 mg total) by mouth every 8 (eight) hours as needed for nausea. 02/16/14   Jacqulyn Cane, MD  Oxygen Permeable Lens Products SOLN by Does not apply route.    Historical Provider, MD  PARoxetine (PAXIL) 10 MG tablet TAKE 1 TABLET BY MOUTH EVERY DAY 12/22/12   Sean Hommel, DO  predniSONE (DELTASONE) 50 MG tablet One by mouth daily only for five days. 05/04/13   Marcial Pacas, DO  Tiotropium Bromide Monohydrate (SPIRIVA RESPIMAT) 2.5 MCG/ACT AERS One inhalation a day. 08/12/13   Sean Hommel, DO   BP 203/90  Pulse 81  Temp(Src) 97.6 F (36.4 C) (Oral)  Ht 5\' 5"  (1.651 m)  Wt 125 lb (56.7 kg)  BMI 20.80 kg/m2  SpO2 95% Physical Exam  Nursing note and vitals  reviewed. Constitutional: He is oriented to person, place, and time. He appears well-developed and well-nourished. No distress.  Alert, cooperative, chronically ill-appearing elderly male sitting in wheelchair. Hard of hearing, but he can hear when we speak loudly. No acute cardiorespiratory distress. Not toxic appearance   HENT:  Head: Normocephalic and atraumatic.  Mouth/Throat: Oropharynx is clear and moist.  Eyes: Conjunctivae and EOM are normal. Pupils are equal, round, and reactive to light. Right eye exhibits no discharge. Left eye exhibits no discharge. No scleral icterus.  Neck: Normal range of motion. No JVD present. No tracheal deviation present.  Cardiovascular: Normal rate, regular rhythm, normal heart sounds and intact distal pulses.   No murmur heard. Pulmonary/Chest: Effort normal. No respiratory distress. He has wheezes (Minimal, late expiratory). He has no rales.    Mild diffuse rhonchi and minimal  expiratory wheezes, which by report is Mark normal baseline COPD.  O2 saturation 95% on room air.  There is severe tenderness to palpation and ecchymosis left posterior lateral rib area. No spinal tenderness or deformity. No CVA tenderness  Abdominal: Soft. Bowel sounds are normal. He exhibits no distension. There is no hepatosplenomegaly. There is no tenderness. There is no rigidity, no rebound, no guarding, no tenderness at McBurney's point and negative Murphy's sign.  Musculoskeletal: Normal range of motion.  Lymphadenopathy:    He has no cervical adenopathy.  Neurological: He is alert and oriented to person, place, and time.  Skin: Skin is warm. No rash noted.  Psychiatric: He has a normal mood and affect.    ED Course  Procedures (including critical care time) Labs Review Labs Reviewed  FERRITIN  FOLATE  VITAMIN B12  COMPREHENSIVE METABOLIC PANEL  TSH  POCT CBC W AUTO DIFF ([place])   hemoglobin 10.0, MCV 83.8   WBC 8 normal 6.6, platelets lower  limits of normal 122,000  Imaging Review Dg Ribs Unilateral W/chest Left  02/16/2014   CLINICAL DATA:  Pain post trauma  EXAM: LEFT RIBS AND CHEST - 3+ VIEW  COMPARISON:  Chest radiograph February 11, 2014  FINDINGS: Frontal chest as well as oblique and cone-down lower rib images were obtained. There is underlying emphysematous change. There is no appreciable edema or consolidation. Heart size is within normal limits. Pulmonary vascularity reflects underlying emphysema. There is atherosclerotic change in aorta. No adenopathy.  No effusion or pneumothorax.  There is no demonstrable rib fracture.  IMPRESSION: No demonstrable rib fracture. No pneumothorax. Underlying emphysema. No edema or consolidation.   Electronically Signed   By: Lowella Grip M.D.   On: 02/16/2014 12:36     MDM   1. Nausea and vomiting, vomiting of unspecified type   2. Anemia, unspecified   3. Contusion of ribs, left, sequela   4. Essential hypertension, benign    Unknown cause for nausea and vomiting, although the nausea and vomiting have since resolved. By Cooley's hx  N/V was associated with Tramadol.--I advised stopping the tramadol.  hemoglobin 10.0, MCV 83.8   WBC 8 normal 6.6, platelets lower limits of normal 122,000--- Hgb was 12 in 2014.--If possible, will add blood work today including ferritin, folate, B12, CMP and TSH and we've arranged for followup appointment with Mark PCP tomorrow 10:15 AM.  Mark metoprolol was DC'd by PCP a month ago for orthostatic hypotension, however, because of today's SBP >200, advised Cooley to give him one-half of the metoprolol daily and Dr. Ileene Rubens can decide on further management tomorrow. He does not have any focal neurologic symptoms, and Mark headache has resolved, and there is no evidence of acute neurologic event. Cooley declined any acute workup for this at this time.  Tylenol for pain. (Alternatively, may try ibuprofen with food for pain provided it doesn't cause nausea.)    Fall precautions. He may not walk on Mark own because of Mark risk of falls. Appointment 10:15 AM tomorrow , Dr. Ileene Rubens  Over 40 minutes spent, greater than 50% of the time spent for counseling and coordination of care. Cooley voiced understanding and agreement with the above.  Also prescribed Zofran 4 mg by mouth q. 8 hours prn nausea  Jacqulyn Cane, MD 02/16/14 2110  Jacqulyn Cane, MD 02/16/14 2111

## 2014-02-16 NOTE — ED Notes (Signed)
Mark Cooley woke this morning and after taking his medications he started vomiting. He was to follow up with Dr Ileene Rubens but due to his vomiting he was to late to be seen.

## 2014-02-17 ENCOUNTER — Telehealth: Payer: Self-pay

## 2014-02-17 ENCOUNTER — Encounter: Payer: Self-pay | Admitting: Family Medicine

## 2014-02-17 ENCOUNTER — Telehealth: Payer: Self-pay | Admitting: *Deleted

## 2014-02-17 ENCOUNTER — Ambulatory Visit (INDEPENDENT_AMBULATORY_CARE_PROVIDER_SITE_OTHER): Payer: Medicare Other | Admitting: Family Medicine

## 2014-02-17 VITALS — BP 103/57 | HR 96 | Temp 97.6°F | Wt 120.0 lb

## 2014-02-17 DIAGNOSIS — R42 Dizziness and giddiness: Secondary | ICD-10-CM

## 2014-02-17 DIAGNOSIS — I10 Essential (primary) hypertension: Secondary | ICD-10-CM

## 2014-02-17 DIAGNOSIS — R413 Other amnesia: Secondary | ICD-10-CM

## 2014-02-17 DIAGNOSIS — S20212A Contusion of left front wall of thorax, initial encounter: Secondary | ICD-10-CM

## 2014-02-17 DIAGNOSIS — S20219A Contusion of unspecified front wall of thorax, initial encounter: Secondary | ICD-10-CM

## 2014-02-17 LAB — COMPREHENSIVE METABOLIC PANEL WITH GFR
ALT: 12 U/L (ref 0–53)
AST: 16 U/L (ref 0–37)
Albumin: 4 g/dL (ref 3.5–5.2)
Alkaline Phosphatase: 50 U/L (ref 39–117)
BUN: 21 mg/dL (ref 6–23)
CO2: 25 meq/L (ref 19–32)
Calcium: 9.1 mg/dL (ref 8.4–10.5)
Chloride: 99 meq/L (ref 96–112)
Creat: 1.14 mg/dL (ref 0.50–1.35)
Glucose, Bld: 347 mg/dL — ABNORMAL HIGH (ref 70–99)
Potassium: 4.5 meq/L (ref 3.5–5.3)
Sodium: 136 meq/L (ref 135–145)
Total Bilirubin: 0.5 mg/dL (ref 0.2–1.2)
Total Protein: 6.3 g/dL (ref 6.0–8.3)

## 2014-02-17 LAB — FOLATE: Folate: >20 ng/mL

## 2014-02-17 LAB — TSH: TSH: 3.195 u[IU]/mL (ref 0.350–4.500)

## 2014-02-17 LAB — VITAMIN B12: Vitamin B-12: 1364 pg/mL — ABNORMAL HIGH (ref 211–911)

## 2014-02-17 LAB — FERRITIN: Ferritin: 35 ng/mL (ref 22–322)

## 2014-02-17 MED ORDER — RIVASTIGMINE 9.5 MG/24HR TD PT24: 9.5000 mg | MEDICATED_PATCH | Freq: Every day | TRANSDERMAL | Status: DC

## 2014-02-17 MED ORDER — TRAMADOL HCL 50 MG PO TABS: 25.0000 mg | ORAL_TABLET | Freq: Three times a day (TID) | ORAL | Status: DC | PRN

## 2014-02-17 NOTE — Progress Notes (Signed)
CC: Mark Cooley is a 80 y.o. male is here for UC f/u   Subjective: HPI:  Followup bruised rib: Last week the patient fell into a stool after losing his balance after standing up quickly. He was seen in a local emergency room and rib fracture was ruled out. He continues to have left lower thorax pain localized posteriorly it is nonradiating described only a sore and worse with twisting movements or deep breaths. Overall he states the pain is bearable and slightly improving but requires tramadol for relief.  There is an opacity seen on his x-ray in the emergency room however it was absent on repeat x-ray yesterday and felt to be due to a shadow of his nipple. He denies any shortness of breath, cough, nor wheezing.  Patient reports continued dizziness and family members believe that it's actually been worse over the past 2 weeks. It's worse when he stands up quickly, he's been getting moderate relief by waiting 5 seconds after standing up before he tries to move or ambulate. Family notices that the dizziness along with nausea and one episode of vomiting has been persistent and noticeably more apparent ever since Exelon patch prescription was increased.   Review Of Systems Outlined In HPI  Past Medical History  Diagnosis Date  . Heart disease   . Hypertension   . Hyperlipidemia   . Diabetes   . Asthma with COPD   . CAD (coronary artery disease)   . Skin cancer   . Hyperlipidemia LDL goal < 70 05/10/2012  . Essential hypertension 05/10/2012  . BPH (benign prostatic hyperplasia) 05/10/2012  . Coronary artery disease 05/10/2012  . COPD (chronic obstructive pulmonary disease) 05/10/2012  . AAA (abdominal aortic aneurysm) 05/10/2012  . Type 2 diabetes mellitus 05/10/2012    Past Surgical History  Procedure Laterality Date  . Eye transplant    . Remove gallstones     Family History  Problem Relation Age of Onset  . Hypertension Father     History   Social History  . Marital Status:  Married    Spouse Name: N/A    Number of Children: N/A  . Years of Education: N/A   Occupational History  . Not on file.   Social History Main Topics  . Smoking status: Former Smoker    Types: Cigarettes  . Smokeless tobacco: Not on file     Comment: quit 25 years ago  . Alcohol Use: No  . Drug Use: Not on file  . Sexual Activity: Not on file   Other Topics Concern  . Not on file   Social History Narrative  . No narrative on file     Objective: BP 103/57  Pulse 96  Temp(Src) 97.6 F (36.4 C) (Oral)  Wt 120 lb (54.432 kg)  General: Alert and Oriented, No Acute Distress HEENT: Pupils equal, round, reactive to light. Conjunctivae clear.  Moist membranes pharynx unremarkable Lungs: Clear to auscultation bilaterally, no wheezing/ronchi/rales.  Comfortable work of breathing. Good air movement. Cardiac: Regular rate and rhythm. Normal S1/S2.  No murmurs, rubs, nor gallops.   Abdomen: Soft nontender Extremities: No peripheral edema.  Strong peripheral pulses. Back: Moderate ecchymoses in the region of the posterior 11th and 12th ribs slightly tender to touch mostly between the ribs as opposed to with direct palpation of the bones  Mental Status: No depression, anxiety, nor agitation. Skin: Warm and dry.  Assessment & Plan: Mark Cooley was seen today for uc f/u.  Diagnoses and associated orders for  this visit:  Essential hypertension  Memory loss - Discontinue: rivastigmine (EXELON) 9.5 mg/24hr; Place 1 patch (9.5 mg total) onto the skin daily. - rivastigmine (EXELON) 9.5 mg/24hr; Place 1 patch (9.5 mg total) onto the skin daily.  Dizziness and giddiness  Bruised rib, left, initial encounter - Discontinue: traMADol (ULTRAM) 50 MG tablet; Take 0.5-1 tablets (25-50 mg total) by mouth every 8 (eight) hours as needed. - traMADol (ULTRAM) 50 MG tablet; Take 0.5-1 tablets (25-50 mg total) by mouth every 8 (eight) hours as needed.    Essential hypertension: Holding off on  metoprolol still due to his orthostasis Dizziness: Felt to be due to orthostatic hypotension worsened by increase in Exelon therefore decreased back to 9.5 mg patch daily provided him with a 30 days but I've asked the family to continue to have his neurologist manage this and memory loss Bruised rib: Discussed the long nature of the recovery process and a state of to a month of pain but discussed red flags that would be more suggestive of cardiopulmonary disease  25 minutes spent face-to-face during visit today of which at least 50% was counseling or coordinating care regarding: 1. Essential hypertension   2. Memory loss   3. Dizziness and giddiness   4. Bruised rib, left, initial encounter       Return if symptoms worsen or fail to improve.

## 2014-02-17 NOTE — Telephone Encounter (Signed)
Mark Cooley, pt's daughter called and wanted to know what should be done about pt's anemia. Left a message on vm ( [phone]) that Dr. Burnett Harry ordered additional labs to f/u on anemia. Advised that pts's with chronic illnesss are not able to produce enough red blood cells and therefore labs reflected that. At this point no changes need to be made

## 2014-02-17 NOTE — ED Notes (Addendum)
Called patient and his daughter states he is doing better. I advised her to call back if she has any concerns or questions.

## 2014-03-03 ENCOUNTER — Other Ambulatory Visit: Payer: Self-pay | Admitting: Family Medicine

## 2014-03-24 ENCOUNTER — Telehealth: Payer: Self-pay | Admitting: *Deleted

## 2014-03-24 NOTE — Telephone Encounter (Signed)
Left message on NP vm at the number she provided

## 2014-03-24 NOTE — Telephone Encounter (Signed)
REcommend f/u visit for A1c check and general diabetic follow up

## 2014-03-24 NOTE — Telephone Encounter (Signed)
NP did a house call on pt today . She ran a urine dipstick and it was noted that he had 4+ glucose in his urine. She wanted to make Dr. Ileene Rubens aware and wanted to know his reccomendations =

## 2014-04-10 ENCOUNTER — Ambulatory Visit: Payer: Medicare Other | Admitting: Family Medicine

## 2014-04-17 ENCOUNTER — Telehealth: Payer: Self-pay | Admitting: *Deleted

## 2014-04-17 NOTE — Telephone Encounter (Signed)
Sharon(daughter) called and states home health nurse said pt's blood sugar was 192 and there was blood in his urine. She wanted to know if 192 was high. She also states pt is "talking out of his head". She states that Friday he fell but didn't hit his head. I advised daughter that he is due for diabetic f/u. They have an appt scheduled on wed

## 2014-04-19 ENCOUNTER — Encounter: Payer: Self-pay | Admitting: Family Medicine

## 2014-04-19 ENCOUNTER — Other Ambulatory Visit: Payer: Self-pay | Admitting: *Deleted

## 2014-04-19 ENCOUNTER — Ambulatory Visit (INDEPENDENT_AMBULATORY_CARE_PROVIDER_SITE_OTHER): Payer: Medicare Other | Admitting: Family Medicine

## 2014-04-19 VITALS — BP 120/65 | HR 90 | Wt 128.0 lb

## 2014-04-19 DIAGNOSIS — J449 Chronic obstructive pulmonary disease, unspecified: Secondary | ICD-10-CM

## 2014-04-19 DIAGNOSIS — G4700 Insomnia, unspecified: Secondary | ICD-10-CM

## 2014-04-19 DIAGNOSIS — R319 Hematuria, unspecified: Secondary | ICD-10-CM

## 2014-04-19 MED ORDER — TRAZODONE HCL 50 MG PO TABS: 25.0000 mg | ORAL_TABLET | Freq: Every evening | ORAL | Status: DC | PRN

## 2014-04-19 MED ORDER — FLUTICASONE-SALMETEROL 500-50 MCG/DOSE IN AEPB: 1.0000 | INHALATION_SPRAY | Freq: Two times a day (BID) | RESPIRATORY_TRACT | Status: DC

## 2014-04-19 MED ORDER — ALBUTEROL SULFATE HFA 108 (90 BASE) MCG/ACT IN AERS
2.0000 | INHALATION_SPRAY | Freq: Four times a day (QID) | RESPIRATORY_TRACT | Status: DC | PRN
Start: 2014-04-19 — End: 2014-04-19

## 2014-04-19 MED ORDER — ALBUTEROL SULFATE HFA 108 (90 BASE) MCG/ACT IN AERS: 2.0000 | INHALATION_SPRAY | Freq: Four times a day (QID) | RESPIRATORY_TRACT | Status: DC | PRN

## 2014-04-19 NOTE — Progress Notes (Signed)
CC: Mark Cooley is a 80 y.o. male is here for Diabetes   Subjective: HPI:  Accompanied by daughter  Daughter reports the patient is having difficulty falling asleep at night but only every other day of the week. It sounds like his neurologist prescribed him Xanax however 0.25 mg have been ineffective and does not seem to influence his behavior or personality or energy level whatsoever per the family's report. Patient denies any difficulty with falling asleep or staying asleep however family confirms that this is not true. He typically goes to bed around 8 or 9:00 but every other day of the week is not uncommon for him to be up until midnight.  He denies anything that particularly influencing his sleeping patterns. No napping during the day.  Followup hematuria: He was recently found to have blood in his urine and he was prescribed ciprofloxacin on the eighth of this month for presumed UTI. Just prior to starting ciprofloxacin he was acting more confused, being rude and verbally aggressive to others, and wandering more often than his baseline. 2 days after starting ciprofloxacin these symptoms have drastically improved. He denies any burning, fevers, chills, penile discharge nor any genitourinary complaints recently or remotely.  Followup COPD: The family is wondering if I can help manage his COPD medications. He is prescribed oxygen 2 L around the clock however only wears it at bedtime. He denies any shortness of breath during waking hours. He currently is taking Advair twice a day and using albuterol one to 2 times a day. Continues to take Spiriva daily. Patient denies any cough or wheezing however all family members report that this is not true and that he coughs frequently throughout the day and others can hear him wheezing  unless he has taken his albuterol one to 2 hours prior to encounter. He denies any chest pain or increased sputum production.   Review Of Systems Outlined In HPI  Past  Medical History  Diagnosis Date  . Heart disease   . Hypertension   . Hyperlipidemia   . Diabetes   . Asthma with COPD   . CAD (coronary artery disease)   . Skin cancer   . Hyperlipidemia LDL goal < 70 05/10/2012  . Essential hypertension 05/10/2012  . BPH (benign prostatic hyperplasia) 05/10/2012  . Coronary artery disease 05/10/2012  . COPD (chronic obstructive pulmonary disease) 05/10/2012  . AAA (abdominal aortic aneurysm) 05/10/2012  . Type 2 diabetes mellitus 05/10/2012    Past Surgical History  Procedure Laterality Date  . Eye transplant    . Remove gallstones     Family History  Problem Relation Age of Onset  . Hypertension Father     History   Social History  . Marital Status: Married    Spouse Name: N/A    Number of Children: N/A  . Years of Education: N/A   Occupational History  . Not on file.   Social History Main Topics  . Smoking status: Former Smoker    Types: Cigarettes  . Smokeless tobacco: Not on file     Comment: quit 25 years ago  . Alcohol Use: No  . Drug Use: Not on file  . Sexual Activity: Not on file   Other Topics Concern  . Not on file   Social History Narrative  . No narrative on file     Objective: BP 120/65  Pulse 90  Wt 128 lb (58.06 kg)  General: Alert and Oriented, No Acute Distress HEENT: Pupils equal, round,  reactive to light. Conjunctivae clear.  External ears unremarkable, canals clear with intact TMs with appropriate landmarks.  Middle ear appears open without effusion. Pink inferior turbinates.  Moist mucous membranes, pharynx without inflammation nor lesions.  Neck supple without palpable lymphadenopathy nor abnormal masses. Lungs: Distant breath sounds in all lung fields, comfortable work of breathing trace wheezing without rhonchi or rales. Cardiac: Regular rate and rhythm. Normal S1/S2.  No murmurs, rubs, nor gallops.   Extremities: No peripheral edema.  Strong peripheral pulses.  Mental Status: No depression, anxiety,  nor agitation. Skin: Warm and dry.  Assessment & Plan: Mark Cooley was seen today for diabetes.  Diagnoses and associated orders for this visit:  Insomnia - traZODone (DESYREL) 50 MG tablet; Take 0.5-1 tablets (25-50 mg total) by mouth at bedtime as needed for sleep.  Hematuria - Urinalysis, Routine w reflex microscopic - Urine culture  Chronic obstructive pulmonary disease, unspecified COPD, unspecified chronic bronchitis type - albuterol (PROVENTIL HFA;VENTOLIN HFA) 108 (90 BASE) MCG/ACT inhaler; Inhale 2 puffs into the lungs every 6 (six) hours as needed for wheezing or shortness of breath. - Fluticasone-Salmeterol (ADVAIR DISKUS) 500-50 MCG/DOSE AEPB; Inhale 1 puff into the lungs 2 (two) times daily.    Insomnia: Consider increasing alprazolam to 0.5 mg at bedtime on the days that he has difficulty sleeping when one of his daughters is absent which seems to be wise occurring every other day. If no benefit then switched to trazodone. Hematuria: Suspect UTI was causing behavioral disturbance, continue remaining 2 days of ciprofloxacin checking UA and urine culture to confirm resolution COPD: Continue Spiriva Advair and encouraged to use albuterol every 6 hours.  During our encounter he was encouraged to use oxygen 24 hours a day, this was emphasized multiple times during our encounter.  The family is looking for eldercare in the home, we discussed options and I have given them the contact information for homewatch caregivers and Charisse March' phone number.  His care will be primarily for the patient and is complicated by the wife having strong reservations about having on family members in the home. Time was taken to explain to the wife the importance of 24 hour supervision of her husband.  40 minutes spent face-to-face during visit today of which at least 50% was counseling or coordinating care regarding: 1. Insomnia   2. Hematuria   3. Chronic obstructive pulmonary disease, unspecified  COPD, unspecified chronic bronchitis type      Return in about 3 months (around 07/20/2014) for Diabetes.

## 2014-04-20 ENCOUNTER — Telehealth: Payer: Self-pay | Admitting: Family Medicine

## 2014-04-20 DIAGNOSIS — R319 Hematuria, unspecified: Secondary | ICD-10-CM

## 2014-04-20 LAB — URINALYSIS, ROUTINE W REFLEX MICROSCOPIC
Bilirubin Urine: NEGATIVE
Glucose, UA: 100 mg/dL — AB
Ketones, ur: NEGATIVE mg/dL
LEUKOCYTES UA: NEGATIVE
NITRITE: NEGATIVE
PROTEIN: NEGATIVE mg/dL
Specific Gravity, Urine: 1.017 (ref 1.005–1.030)
Urobilinogen, UA: 0.2 mg/dL (ref 0.0–1.0)
pH: 5.5 (ref 5.0–8.0)

## 2014-04-20 LAB — URINALYSIS, MICROSCOPIC ONLY
BACTERIA UA: NONE SEEN
Casts: NONE SEEN
Crystals: NONE SEEN

## 2014-04-20 MED ORDER — CIPROFLOXACIN HCL 500 MG PO TABS: 500.0000 mg | ORAL_TABLET | Freq: Two times a day (BID) | ORAL | Status: DC

## 2014-04-20 NOTE — Telephone Encounter (Signed)
Ivin Booty, pt' daughter notified

## 2014-04-20 NOTE — Telephone Encounter (Signed)
Seth Bake, Will you please let patient's family know that he had trace amounts of blood in his urine still therefore I'd recommend extending his cipro regimen another 10 days, new Rx has been sent to his pharmacy.

## 2014-04-21 LAB — URINE CULTURE
Colony Count: NO GROWTH
Organism ID, Bacteria: NO GROWTH

## 2014-05-09 ENCOUNTER — Emergency Department (INDEPENDENT_AMBULATORY_CARE_PROVIDER_SITE_OTHER)
Admission: EM | Admit: 2014-05-09 | Discharge: 2014-05-09 | Disposition: A | Discharge status: Home / Self Care | Payer: Medicare Other | Source: Home / Self Care | Attending: Emergency Medicine | Admitting: Emergency Medicine

## 2014-05-09 ENCOUNTER — Encounter: Payer: Self-pay | Admitting: *Deleted

## 2014-05-09 DIAGNOSIS — F0391 Unspecified dementia with behavioral disturbance: Secondary | ICD-10-CM

## 2014-05-09 DIAGNOSIS — F0280 Dementia in other diseases classified elsewhere without behavioral disturbance: Secondary | ICD-10-CM

## 2014-05-09 DIAGNOSIS — R319 Hematuria, unspecified: Secondary | ICD-10-CM

## 2014-05-09 DIAGNOSIS — R42 Dizziness and giddiness: Secondary | ICD-10-CM

## 2014-05-09 DIAGNOSIS — G309 Alzheimer's disease, unspecified: Secondary | ICD-10-CM

## 2014-05-09 LAB — POCT URINALYSIS DIP (MANUAL ENTRY)
Bilirubin, UA: NEGATIVE
Glucose, UA: NEGATIVE
Ketones, POC UA: NEGATIVE
Leukocytes, UA: NEGATIVE
Nitrite, UA: NEGATIVE
Protein Ur, POC: NEGATIVE
Spec Grav, UA: 1.015 (ref 1.005–1.03)
Urobilinogen, UA: 0.2 (ref 0–1)
pH, UA: 5.5 (ref 5–8)

## 2014-05-09 LAB — POCT CBC W AUTO DIFF (K'VILLE URGENT CARE)

## 2014-05-09 LAB — POCT FASTING CBG KUC MANUAL ENTRY: POCT Glucose (KUC): 212 mg/dL — AB (ref 70–99)

## 2014-05-09 MED ORDER — SULFAMETHOXAZOLE-TRIMETHOPRIM 800-160 MG PO TABS: 1.0000 | ORAL_TABLET | Freq: Two times a day (BID) | ORAL | Status: DC

## 2014-05-09 NOTE — ED Provider Notes (Signed)
CSN: [id number]     Arrival date & time 05/09/14  1142 History   First MD Initiated Contact with Patient 05/09/14 1220     Chief Complaint  Patient presents with  . Dizziness  his daughter brings him in requesting repeat urinalysis, CBC, nonfasting blood sugar because of three weeks of intermittent dizziness. HPI Mehkai, accompanied by his daughter, c/o intermittent dizziness x 3 weeks.he can't be more specific as to whether it's vertigo or lightheadedness. He has been treated for uti with 2 courses of cipro which he finished 5 days ago. Denies urinary difficulties. UCX done by Dr. Ileene Rubens was negative.  Denies nausea or vomiting or chest pain or shortness of breath or abdominal pain. Past Medical History  Diagnosis Date  . Heart disease   . Hypertension   . Hyperlipidemia   . Diabetes   . Asthma with COPD   . CAD (coronary artery disease)   . Skin cancer   . Hyperlipidemia LDL goal < 70 05/10/2012  . Essential hypertension 05/10/2012  . BPH (benign prostatic hyperplasia) 05/10/2012  . Coronary artery disease 05/10/2012  . COPD (chronic obstructive pulmonary disease) 05/10/2012  . AAA (abdominal aortic aneurysm) 05/10/2012  . Type 2 diabetes mellitus 05/10/2012   Past Surgical History  Procedure Laterality Date  . Eye transplant    . Remove gallstones     Family History  Problem Relation Age of Onset  . Hypertension Father    History  Substance Use Topics  . Smoking status: Former Smoker    Types: Cigarettes  . Smokeless tobacco: Not on file     Comment: quit 25 years ago  . Alcohol Use: No    Review of Systems  All other systems reviewed and are negative.   Allergies  Donepezil  Home Medications   Prior to Admission medications   Medication Sig Start Date End Date Taking? Authorizing Provider  albuterol (PROVENTIL HFA;VENTOLIN HFA) 108 (90 BASE) MCG/ACT inhaler Inhale 2 puffs into the lungs every 6 (six) hours as needed for wheezing or shortness of breath. 04/19/14    Marcial Pacas, DO  ALPRAZolam (XANAX) 0.5 MG tablet Take 0.5 mg by mouth at bedtime as needed for anxiety.    Historical Provider, MD  [place] filled oxygen tank Dx COPD 496 10/29/12   Marcial Pacas, DO  AMBULATORY NON FORMULARY MEDICATION Nebulizer machine  WP:[id number] 11/01/12   Sean Hommel, DO  aspirin 81 MG tablet Take 81 mg by mouth daily.    Historical Provider, MD  azelastine (OPTIVAR) 0.05 % ophthalmic solution Place 1 drop into both eyes 2 (two) times daily. 05/27/13   Jade L Breeback, PA-C  Calcium Carb-Cholecalciferol (CALCIUM + D3) 600-200 MG-UNIT TABS Take by mouth daily.    Historical Provider, MD  ciprofloxacin (CIPRO) 500 MG tablet Take 1 tablet (500 mg total) by mouth 2 (two) times daily. 04/20/14   Sean Hommel, DO  CRESTOR 20 MG tablet TAKE 1 TABLET (20 MG TOTAL) BY MOUTH DAILY. 08/31/13   Sean Hommel, DO  diphenhydramine-acetaminophen (TYLENOL PM) 25-500 MG TABS Take 1 tablet by mouth at bedtime as needed.    Historical Provider, MD  fluoruracil [facility]) 0.5 % cream Apply topically daily.    Historical Provider, MD  fluticasone (FLONASE) 50 MCG/ACT nasal spray Place 2 sprays into both nostrils daily. 05/27/13   Jade L Breeback, PA-C  glipiZIDE-metformin (METAGLIP) 5-500 MG per tablet TAKE TWO TABLETS BY MOUTH EVERY MORNING AND ONE TABLET BY MOUTH EVERY EVENING  Sean Hommel, DO  Glucose Blood (TRUETRACK TEST VI)     Historical Provider, MD  NAMENDA 10 MG tablet TAKE 1 TABLET (10 MG TOTAL) BY MOUTH 2 (TWO) TIMES DAILY.    Sean Hommel, DO  nitroGLYCERIN (NITROSTAT) 0.4 MG SL tablet Place 0.4 mg under the tongue. Place one tablet under the tongue every five minutes as needed    Historical Provider, MD  Omega-3 Fatty Acids (FISH OIL) 1000 MG CAPS Take by mouth.    Historical Provider, MD  ondansetron (ZOFRAN-ODT) 4 MG disintegrating tablet Take 1 tablet (4 mg total) by mouth every 8 (eight) hours as needed for nausea. 02/16/14   Jacqulyn Cane, MD  Oxygen  [employer] SOLN by Does not apply route.    Historical Provider, MD  PARoxetine (PAXIL) 10 MG tablet TAKE 1 TABLET BY MOUTH EVERY DAY 03/03/14   Marcial Pacas, DO  rivastigmine (EXELON) 9.5 mg/24hr Place 1 patch (9.5 mg total) onto the skin daily. 02/17/14   Marcial Pacas, DO  Tiotropium Bromide Monohydrate (SPIRIVA RESPIMAT) 2.5 MCG/ACT AERS One inhalation a day. 08/12/13   Marcial Pacas, DO  traMADol (ULTRAM) 50 MG tablet Take 0.5-1 tablets (25-50 mg total) by mouth every 8 (eight) hours as needed. 02/17/14   Marcial Pacas, DO  traZODone (DESYREL) 50 MG tablet Take 0.5-1 tablets (25-50 mg total) by mouth at bedtime as needed for sleep. 04/19/14   Sean Hommel, DO   BP 133/67 mmHg  Pulse 69  Temp(Src) 97.4 F (36.3 C) (Oral)  Resp 16  SpO2 99% Physical Exam  Constitutional: He is oriented to person, place, and time. He appears well-developed and well-nourished. No distress.  No acute distress. Alert and cooperative. He is mildly confused which is his normal baseline of Alzheimer's.  HENT:  Head: Normocephalic and atraumatic.  Eyes: Conjunctivae and EOM are normal. Pupils are equal, round, and reactive to light. No scleral icterus.  Neck: Normal range of motion.  Cardiovascular: Normal rate.   Pulmonary/Chest: Effort normal.  Abdominal: He exhibits no distension.  Musculoskeletal: Normal range of motion.  Neurological: He is alert and oriented to person, place, and time.  Skin: Skin is warm.  Psychiatric: He has a normal mood and affect.  Nursing note and vitals reviewed.   ED Course  Procedures (including critical care time) Labs Review Labs Reviewed  POCT FASTING CBG [facility] MANUAL ENTRY - Abnormal; Notable for the following:    POCT Glucose ([facility]) 212 (*)    All other components within normal limits  URINE CULTURE  POCT URINALYSIS DIP (MANUAL ENTRY)  POCT CBC W AUTO DIFF ([place])   Results for orders placed or performed during the hospital encounter of 05/09/14   POCT urinalysis dipstick (new)  Result Value Ref Range   Color, UA yellow    Clarity, UA clear    Glucose, UA neg    Bilirubin, UA negative    Bilirubin, UA negative    Spec Grav, UA 1.015 1.005 - 1.03   Blood, UA moderate    pH, UA 5.5 5 - 8   Protein Ur, POC negative    Urobilinogen, UA 0.2 0 - 1   Nitrite, UA Negative    Leukocytes, UA Negative   CBC w auto diff ([facility])  Result Value Ref Range   WBC  4.5 - 10.5 K/uL   Lymphocytes relative %  15 - 45 %   Monocytes relative %  2 - 10 %   Neutrophils relative % (  GR)  44 - 76 %   Lymphocytes absolute  0.1 - 1.8 K/uL   Monocyes absolute  0.1 - 1 K/uL   Neutrophils absolute (GR#)  1.7 - 7.7 K/uL   RBC  4.2 - 5.8 MIL/uL   Hemoglobin  13 - 17 g/dL   Hematocrit  38.5 - 51 %   MCV  80 - 98 fL   MCH  26.5 - 32.5 pg   MCHC  32.5 - 36.9 g/dL   RDW  11.6 - 14 %   Platelet count  140 - 400 K/uL   MPV  7.8 - 11 fL  POCT fasting CBG (manual entry)  Result Value Ref Range   POCT Glucose ([facility]) 212 (A) 70 - 99 mg/dL   Urinalysis shows moderate blood, otherwise negative. CBC shows normal WBC 5.4. His hemoglobin is 11.2, improved from 10.0 from August  MDM   1. Dizzinesses   2. Hematuria   clinically no evidence of acute neurologic event. Dizziness and lightheadedness could be multifactorial. His hemoglobin is actually improved from August. Its possible that some of the symptoms could be from recent Cipro, but I cannot be certain of that.  Discussed the above with daughter. Over 30 minutes spent, greater than 50% of the time spent for counseling and coordination of care.  We'll culture urine. Daughter requests a different antibiotic, and after risks, benefits, alternatives discussed, Septra DS twice a day 7 days prescribed. She declined any other testing at this time. His neurologic exam shows no focal deficits and he did not exhibit orthostatic symptoms today. I explained that his dizziness and lightheadedness could  be multifactorial, but I don't have a definite cause. Follow-up with PCP within 1-2 weeks, sooner if needed.   Jacqulyn Cane, MD 05/09/14 [phone]

## 2014-05-09 NOTE — ED Notes (Signed)
Mark Cooley, accompanied by his daughter, c/o intermittent dizziness x 3 weeks. He has been treated for uti with 2 courses of cipro which he finished 5 days ago. Denies urinary difficulties. UCX done by Dr. Ileene Rubens was negative.

## 2014-05-11 LAB — URINE CULTURE
Colony Count: NO GROWTH
Organism ID, Bacteria: NO GROWTH

## 2014-05-15 ENCOUNTER — Other Ambulatory Visit: Payer: Self-pay | Admitting: Family Medicine

## 2014-05-21 ENCOUNTER — Other Ambulatory Visit: Payer: Self-pay | Admitting: Physician Assistant

## 2014-06-19 ENCOUNTER — Encounter: Payer: Self-pay | Admitting: *Deleted

## 2014-06-19 ENCOUNTER — Emergency Department
Admission: EM | Admit: 2014-06-19 | Discharge: 2014-06-19 | Disposition: A | Discharge status: Home / Self Care | Payer: Medicare Other | Source: Home / Self Care | Attending: Family Medicine | Admitting: Family Medicine

## 2014-06-19 DIAGNOSIS — S6000XA Contusion of unspecified finger without damage to nail, initial encounter: Secondary | ICD-10-CM

## 2014-06-19 NOTE — ED Notes (Signed)
Mark Cooley and his daughter reports his right index finger being pinched by wood that fell 4 days ago. He has a blood blister to his finger that she reports has doubled in size. He denies pain.

## 2014-06-19 NOTE — ED Provider Notes (Addendum)
CSN: [id number]     Arrival date & time 06/19/14  1437 History   None    No chief complaint on file.   HPI  R index finger hematoma  Pt was working on moving some wood last week when one piece of wood hit his finger.  Pt developed mild superficial hematoma. Hematoma progressively worsened over the course week  No fevers or chills No pain  No erythema or purulent drainage  Past Medical History  Diagnosis Date  . Heart disease   . Hypertension   . Hyperlipidemia   . Diabetes   . Asthma with COPD   . CAD (coronary artery disease)   . Skin cancer   . Hyperlipidemia LDL goal < 70 05/10/2012  . Essential hypertension 05/10/2012  . BPH (benign prostatic hyperplasia) 05/10/2012  . Coronary artery disease 05/10/2012  . COPD (chronic obstructive pulmonary disease) 05/10/2012  . AAA (abdominal aortic aneurysm) 05/10/2012  . Type 2 diabetes mellitus 05/10/2012   Past Surgical History  Procedure Laterality Date  . Eye transplant    . Remove gallstones     Family History  Problem Relation Age of Onset  . Hypertension Father    History  Substance Use Topics  . Smoking status: Former Smoker    Types: Cigarettes  . Smokeless tobacco: Not on file     Comment: quit 25 years ago  . Alcohol Use: No    Review of Systems  All other systems reviewed and are negative.   Allergies  Donepezil  Home Medications   Prior to Admission medications   Medication Sig Start Date End Date Taking? Authorizing Provider  albuterol (PROVENTIL HFA;VENTOLIN HFA) 108 (90 BASE) MCG/ACT inhaler Inhale 2 puffs into the lungs every 6 (six) hours as needed for wheezing or shortness of breath. 04/19/14   Marcial Pacas, DO  ALPRAZolam (XANAX) 0.5 MG tablet Take 0.5 mg by mouth at bedtime as needed for anxiety.    Historical Provider, MD  [place] filled oxygen tank Dx COPD 496 10/29/12   Marcial Pacas, DO  AMBULATORY NON FORMULARY MEDICATION Nebulizer machine  [id number] 11/01/12    Sean Hommel, DO  aspirin 81 MG tablet Take 81 mg by mouth daily.    Historical Provider, MD  azelastine (OPTIVAR) 0.05 % ophthalmic solution PLACE 1 DROP INTO BOTH EYES 2 (TWO) TIMES DAILY. 05/22/14   Marcial Pacas, DO  Calcium Carb-Cholecalciferol (CALCIUM + D3) 600-200 MG-UNIT TABS Take by mouth daily.    Historical Provider, MD  CRESTOR 20 MG tablet TAKE 1 TABLET (20 MG TOTAL) BY MOUTH DAILY. 08/31/13   Sean Hommel, DO  diphenhydramine-acetaminophen (TYLENOL PM) 25-500 MG TABS Take 1 tablet by mouth at bedtime as needed.    Historical Provider, MD  fluoruracil [facility]) 0.5 % cream Apply topically daily.    Historical Provider, MD  fluticasone (FLONASE) 50 MCG/ACT nasal spray Place 2 sprays into both nostrils daily. 05/27/13   Jade L Breeback, PA-C  glipiZIDE-metformin (METAGLIP) 5-500 MG per tablet TAKE TWO TABLETS BY MOUTH EVERY MORNING AND ONE TABLET BY MOUTH EVERY EVENING 05/16/14   Sean Hommel, DO  Glucose Blood (TRUETRACK TEST VI)     Historical Provider, MD  NAMENDA 10 MG tablet TAKE 1 TABLET (10 MG TOTAL) BY MOUTH 2 (TWO) TIMES DAILY.    Sean Hommel, DO  nitroGLYCERIN (NITROSTAT) 0.4 MG SL tablet Place 0.4 mg under the tongue. Place one tablet under the tongue every five minutes as needed  Historical Provider, MD  Omega-3 Fatty Acids (FISH OIL) 1000 MG CAPS Take by mouth.    Historical Provider, MD  ondansetron (ZOFRAN-ODT) 4 MG disintegrating tablet Take 1 tablet (4 mg total) by mouth every 8 (eight) hours as needed for nausea. 02/16/14   Jacqulyn Cane, MD  Oxygen Permeable Lens Products SOLN by Does not apply route.    Historical Provider, MD  PARoxetine (PAXIL) 10 MG tablet TAKE 1 TABLET BY MOUTH EVERY DAY 03/03/14   Marcial Pacas, DO  rivastigmine (EXELON) 9.5 mg/24hr Place 1 patch (9.5 mg total) onto the skin daily. 02/17/14   Sean Hommel, DO  sulfamethoxazole-trimethoprim (SEPTRA DS) 800-160 MG per tablet Take 1 tablet by mouth 2 (two) times daily. X 7 days 05/09/14   Jacqulyn Cane, MD    Tiotropium Bromide Monohydrate (SPIRIVA RESPIMAT) 2.5 MCG/ACT AERS One inhalation a day. 08/12/13   Marcial Pacas, DO  traMADol (ULTRAM) 50 MG tablet Take 0.5-1 tablets (25-50 mg total) by mouth every 8 (eight) hours as needed. 02/17/14   Marcial Pacas, DO  traZODone (DESYREL) 50 MG tablet Take 0.5-1 tablets (25-50 mg total) by mouth at bedtime as needed for sleep. 04/19/14   Marcial Pacas, DO   There were no vitals taken for this visit. Physical Exam  Constitutional: He appears well-developed and well-nourished.  HENT:  Head: Normocephalic and atraumatic.  Eyes: Pupils are equal, round, and reactive to light.  Neck: Normal range of motion. Neck supple.  Cardiovascular: Normal rate.   Pulmonary/Chest: Effort normal.  Abdominal: Soft.  Musculoskeletal:       Hands: Superficial hematoma on R index finger  No pain/TTP  No purulent drainage    Neurological: He is alert.  Skin: Skin is warm.    ED Course  Apiration of blood/fluid Date/Time: 06/19/2014 3:27 PM Performed by: Shanda Howells Authorized by: Shanda Howells Consent: Verbal consent obtained. Risks and benefits: risks, benefits and alternatives were discussed Preparation: Patient was prepped and draped in the usual sterile fashion. Local anesthesia used: no Patient sedated: no Patient tolerance: Patient tolerated the procedure well with no immediate complications Comments: Affected area prepped and draped.  Superficial hematoma manually drained/aspirated w/ 18 gauge needle Topical bactroban and dressing applied     (including critical care time) Labs Review Labs Reviewed - No data to display  Imaging Review No results found.   MDM   1. Traumatic hematoma of finger, initial encounter    Superficial traumatic hematoma w/o sings of infection or MSK injury Tetanus UTD  Hematoma manually drained at bedside  Topical antibiotic appolied to affected area and dressed  Discussed infectious red flags and general care  at  length Follow up w/ PCP in 3-5 days for recheck.     The patient and/or caregiver has been counseled thoroughly with regard to treatment plan and/or medications prescribed including dosage, schedule, interactions, rationale for use, and possible side effects and they verbalize understanding. Diagnoses and expected course of recovery discussed and will return if not improved as expected or if the condition worsens. Patient and/or caregiver verbalized understanding.          Shanda Howells, MD 06/19/14 [place], MD 06/19/14 [phone]

## 2014-06-22 ENCOUNTER — Ambulatory Visit: Payer: Medicare Other | Admitting: Family Medicine

## 2014-06-23 ENCOUNTER — Ambulatory Visit (INDEPENDENT_AMBULATORY_CARE_PROVIDER_SITE_OTHER): Payer: Medicare Other | Admitting: Family Medicine

## 2014-06-23 ENCOUNTER — Encounter: Payer: Self-pay | Admitting: Family Medicine

## 2014-06-23 VITALS — BP 133/76 | HR 86 | Temp 97.6°F | Wt 126.0 lb

## 2014-06-23 DIAGNOSIS — T148 Other injury of unspecified body region: Secondary | ICD-10-CM

## 2014-06-23 DIAGNOSIS — T148XXA Other injury of unspecified body region, initial encounter: Secondary | ICD-10-CM

## 2014-06-23 MED ORDER — FLUTICASONE-SALMETEROL 250-50 MCG/DOSE IN AEPB: 1.0000 | INHALATION_SPRAY | Freq: Two times a day (BID) | RESPIRATORY_TRACT | Status: DC

## 2014-06-23 MED ORDER — CLINDAMYCIN HCL 300 MG PO CAPS: 300.0000 mg | ORAL_CAPSULE | Freq: Two times a day (BID) | ORAL | Status: DC

## 2014-06-23 NOTE — Progress Notes (Signed)
CC: Mark Cooley is a 80 y.o. male is here for f/u Urgent Care visit   Subjective: HPI:  This past week and patient had a piece of wood fall on his Right index finger. He had a small bruise within a few minutes on the dorsal aspect of the index finger. Over 24 hours it was spreading and after it reached a width of 1 cm and a length of approximately 3 cm he was brought to the urgent care center next door. The collection of blood was aspirated with a syringe and needle. At home they've been providing wound care by wrapping with a sterile gauze changed every day. Patient denies any pain prior or after the procedure. He has no complaints today at all. The family is concerned that he keeps taking the wrappings off when family members are not constantly monitoring him. 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 He denies any skin changes elsewhere, joint pain, nor fever nor chills.   Review Of Systems Outlined In HPI  Past Medical History  Diagnosis Date  . Heart disease   . Hypertension   . Hyperlipidemia   . Diabetes   . Asthma with COPD   . CAD (coronary artery disease)   . Skin cancer   . Hyperlipidemia LDL goal < 70 05/10/2012  . Essential hypertension 05/10/2012  . BPH (benign prostatic hyperplasia) 05/10/2012  . Coronary artery disease 05/10/2012  . COPD (chronic obstructive pulmonary disease) 05/10/2012  . AAA (abdominal aortic aneurysm) 05/10/2012  . Type 2 diabetes mellitus 05/10/2012    Past Surgical History  Procedure Laterality Date  . Eye transplant    . Remove gallstones     Family History  Problem Relation Age of Onset  . Hypertension Father     History   Social History  . Marital Status: Married    Spouse Name: N/A    Number of Children: N/A  . Years of Education: N/A   Occupational History  . Not on file.   Social History Main Topics  . Smoking status: Former Smoker    Types: Cigarettes  .  Smokeless tobacco: Never Used     Comment: quit 25 years ago  . Alcohol Use: No  . Drug Use: No  . Sexual Activity: Not on file   Other Topics Concern  . Not on file   Social History Narrative     Objective: BP 133/76 mmHg  Pulse 86  Temp(Src) 97.6 F (36.4 C) (Oral)  Wt 126 lb (57.153 kg)  Vital signs reviewed. General: Alert and Oriented, No Acute Distress HEENT: Pupils equal, round, reactive to light. Conjunctivae clear.  External ears unremarkable.  Moist mucous membranes. Lungs: Clear and comfortable work of breathing, speaking in full sentences without accessory muscle use. Cardiac: Regular rate and rhythm.  Neuro: CN II-XII grossly intact, gait normal. Extremities: No peripheral edema.  Strong peripheral pulses.  Mental Status: No depression, anxiety, nor agitation. Logical though process. Skin: Warm and dry. The dorsal aspect of the right index finger he has a 1 cm x 3 cm Drained and flaccid blister with scant blood inside of the cavity. There is no erythema or skin abnormalities elsewhere surrounding the blister.   Assessment & Plan: Mark Cooley was seen today for f/u urgent care visit.  Diagnoses and associated orders for this visit:  Blood blister - clindamycin (CLEOCIN) 300 MG capsule; Take 1 capsule (300 mg total) by mouth 2 (  two) times daily.  Other Orders - Fluticasone-Salmeterol (ADVAIR DISKUS) 250-50 MCG/DOSE AEPB; Inhale 1 puff into the lungs 2 (two) times daily.   Blood blister: It looks like the flaccid blister is healing well, the daughter and I are both concerned that he could get this infected with his forgetfulness is always taking off the bandages and working in dirty environments at home. We'll start a low dose of clindamycin to help with prophylaxis of infection. Begin Epsom salts soaking for 15 minutes in warm water twice a day for the next week.  Advair was added above because it was for some reason taken off of his medication list.  Return if  symptoms worsen or fail to improve.

## 2014-06-24 ENCOUNTER — Other Ambulatory Visit: Payer: Self-pay | Admitting: Family Medicine

## 2014-06-26 ENCOUNTER — Other Ambulatory Visit: Payer: Self-pay | Admitting: Family Medicine

## 2014-07-14 DIAGNOSIS — G309 Alzheimer's disease, unspecified: Secondary | ICD-10-CM | POA: Diagnosis not present

## 2014-07-14 DIAGNOSIS — F09 Unspecified mental disorder due to known physiological condition: Secondary | ICD-10-CM | POA: Diagnosis not present

## 2014-07-20 ENCOUNTER — Ambulatory Visit: Payer: Medicare Other | Admitting: Family Medicine

## 2014-07-20 ENCOUNTER — Emergency Department (INDEPENDENT_AMBULATORY_CARE_PROVIDER_SITE_OTHER): Payer: Medicare Other

## 2014-07-20 ENCOUNTER — Emergency Department
Admission: EM | Admit: 2014-07-20 | Discharge: 2014-07-20 | Disposition: A | Discharge status: Home / Self Care | Payer: Medicare Other | Source: Home / Self Care | Attending: Emergency Medicine | Admitting: Emergency Medicine

## 2014-07-20 ENCOUNTER — Encounter: Payer: Self-pay | Admitting: Emergency Medicine

## 2014-07-20 DIAGNOSIS — J208 Acute bronchitis due to other specified organisms: Secondary | ICD-10-CM

## 2014-07-20 DIAGNOSIS — F17200 Nicotine dependence, unspecified, uncomplicated: Secondary | ICD-10-CM | POA: Diagnosis not present

## 2014-07-20 DIAGNOSIS — R05 Cough: Secondary | ICD-10-CM

## 2014-07-20 DIAGNOSIS — R059 Cough, unspecified: Secondary | ICD-10-CM

## 2014-07-20 DIAGNOSIS — J984 Other disorders of lung: Secondary | ICD-10-CM | POA: Diagnosis not present

## 2014-07-20 DIAGNOSIS — J209 Acute bronchitis, unspecified: Secondary | ICD-10-CM | POA: Diagnosis not present

## 2014-07-20 MED ORDER — AZITHROMYCIN 250 MG PO TABS: 250.0000 mg | ORAL_TABLET | Freq: Every day | ORAL | Status: DC

## 2014-07-20 NOTE — Discharge Instructions (Signed)

## 2014-07-20 NOTE — ED Provider Notes (Signed)
CSN: [id number]     Arrival date & time 07/20/14  1140 History   First MD Initiated Contact with Patient 07/20/14 1159     Chief Complaint  Patient presents with  . Cough   (Consider location/radiation/quality/duration/timing/severity/associated sxs/prior Treatment) Patient is a 81 y.o. male presenting with cough. The history is provided by the patient. No language interpreter was used.  Cough Cough characteristics:  Productive Sputum characteristics:  Nondescript Severity:  Moderate Onset quality:  Gradual Duration:  2 days Timing:  Constant Chronicity:  New Smoker: no   Relieved by:  Nothing Worsened by:  Nothing tried Ineffective treatments:  None tried Associated symptoms: no chest pain and no chills     Past Medical History  Diagnosis Date  . Heart disease   . Hypertension   . Hyperlipidemia   . Diabetes   . Asthma with COPD   . CAD (coronary artery disease)   . Skin cancer   . Hyperlipidemia LDL goal < 70 05/10/2012  . Essential hypertension 05/10/2012  . BPH (benign prostatic hyperplasia) 05/10/2012  . Coronary artery disease 05/10/2012  . COPD (chronic obstructive pulmonary disease) 05/10/2012  . AAA (abdominal aortic aneurysm) 05/10/2012  . Type 2 diabetes mellitus 05/10/2012   Past Surgical History  Procedure Laterality Date  . Eye transplant    . Remove gallstones     Family History  Problem Relation Age of Onset  . Hypertension Father    History  Substance Use Topics  . Smoking status: Former Smoker    Types: Cigarettes  . Smokeless tobacco: Never Used     Comment: quit 25 years ago  . Alcohol Use: No  Pt reports he coughed up a bucket of phelgm.    Review of Systems  Constitutional: Negative for chills.  Respiratory: Positive for cough.   Cardiovascular: Negative for chest pain.  All other systems reviewed and are negative.   Allergies  Donepezil  Home Medications   Prior to Admission medications   Medication Sig Start Date End Date  Taking? Authorizing Provider  albuterol (PROVENTIL HFA;VENTOLIN HFA) 108 (90 BASE) MCG/ACT inhaler Inhale 2 puffs into the lungs every 6 (six) hours as needed for wheezing or shortness of breath. 04/19/14   Marcial Pacas, DO  ALPRAZolam (XANAX) 0.5 MG tablet Take 0.5 mg by mouth at bedtime as needed for anxiety.    Historical Provider, MD  [place] filled oxygen tank Dx COPD 496 10/29/12   Marcial Pacas, DO  AMBULATORY NON FORMULARY MEDICATION Nebulizer machine  [id number] 11/01/12   Sean Hommel, DO  aspirin 81 MG tablet Take 81 mg by mouth daily.    Historical Provider, MD  azelastine (OPTIVAR) 0.05 % ophthalmic solution PLACE 1 DROP INTO BOTH EYES 2 (TWO) TIMES DAILY. 05/22/14   Marcial Pacas, DO  Calcium Carb-Cholecalciferol (CALCIUM + D3) 600-200 MG-UNIT TABS Take by mouth daily.    Historical Provider, MD  clindamycin (CLEOCIN) 300 MG capsule Take 1 capsule (300 mg total) by mouth 2 (two) times daily. 06/23/14   Sean Hommel, DO  CRESTOR 20 MG tablet TAKE 1 TABLET (20 MG TOTAL) BY MOUTH DAILY. 08/31/13   Sean Hommel, DO  diphenhydramine-acetaminophen (TYLENOL PM) 25-500 MG TABS Take 1 tablet by mouth at bedtime as needed.    Historical Provider, MD  fluoruracil [facility]) 0.5 % cream Apply topically daily.    Historical Provider, MD  fluticasone (FLONASE) 50 MCG/ACT nasal spray Place 2 sprays into both nostrils daily. 05/27/13   Luvenia Starch  L Breeback, PA-C  Fluticasone-Salmeterol (ADVAIR DISKUS) 500-50 MCG/DOSE AEPB Inhale 1 puff into the lungs. 06/23/14   Sean Hommel, DO  glipiZIDE-metformin (METAGLIP) 5-500 MG per tablet TAKE TWO TABLETS BY MOUTH EVERY MORNING AND ONE TABLET BY MOUTH EVERY EVENING 05/16/14   Sean Hommel, DO  Glucose Blood (TRUETRACK TEST VI)     Historical Provider, MD  memantine (NAMENDA) 10 MG tablet TAKE 1 TABLET (10 MG TOTAL) BY MOUTH 2 (TWO) TIMES DAILY. 06/26/14   Sean Hommel, DO  memantine (NAMENDA) 10 MG tablet TAKE 1 TABLET (10 MG TOTAL) BY MOUTH 2  (TWO) TIMES DAILY. 06/26/14   Sean Hommel, DO  nitroGLYCERIN (NITROSTAT) 0.4 MG SL tablet Place 0.4 mg under the tongue. Place one tablet under the tongue every five minutes as needed    Historical Provider, MD  Omega-3 Fatty Acids (FISH OIL) 1000 MG CAPS Take by mouth.    Historical Provider, MD  Oxygen Permeable Lens Products SOLN by Does not apply route.    Historical Provider, MD  PARoxetine (PAXIL) 10 MG tablet TAKE 1 TABLET BY MOUTH EVERY DAY 03/03/14   Marcial Pacas, DO  rivastigmine (EXELON) 9.5 mg/24hr Place 1 patch (9.5 mg total) onto the skin daily. 02/17/14   Marcial Pacas, DO  Tiotropium Bromide Monohydrate (SPIRIVA RESPIMAT) 2.5 MCG/ACT AERS One inhalation a day. 08/12/13   Marcial Pacas, DO  traMADol (ULTRAM) 50 MG tablet Take 0.5-1 tablets (25-50 mg total) by mouth every 8 (eight) hours as needed. 02/17/14   Marcial Pacas, DO  traZODone (DESYREL) 50 MG tablet Take 0.5-1 tablets (25-50 mg total) by mouth at bedtime as needed for sleep. 04/19/14   Sean Hommel, DO   BP 121/63 mmHg  Pulse 87  Temp(Src) 97.6 F (36.4 C) (Oral)  Wt 125 lb (56.7 kg)  SpO2 99% Physical Exam  Constitutional: He is oriented to person, place, and time. He appears well-developed and well-nourished.  HENT:  Head: Normocephalic.  Right Ear: External ear normal.  Left Ear: External ear normal.  Eyes: Conjunctivae and EOM are normal. Pupils are equal, round, and reactive to light.  Neck: Normal range of motion.  Cardiovascular: Normal rate and regular rhythm.   Pulmonary/Chest: Effort normal and breath sounds normal.  Abdominal: Soft. He exhibits no distension.  Musculoskeletal: Normal range of motion.  Neurological: He is alert and oriented to person, place, and time.  Skin: Skin is warm.  Psychiatric: He has a normal mood and affect.  Nursing note and vitals reviewed.   ED Course  Procedures (including critical care time) Labs Review Labs Reviewed - No data to display  Imaging Review Dg Chest 2  View  07/20/2014   CLINICAL DATA:  Persistent productive cough for 3 weeks. Former smoker. History of COPD and heart disease.  EXAM: CHEST  2 VIEW  COMPARISON:  02/16/2014 and 02/11/2014  FINDINGS: Cardiac silhouette is within normal limits. Mild prominence calcification of the thoracic aorta is unchanged. Lungs remain hyperinflated. Chronic blunting of the costophrenic angles is unchanged without sizable pleural effusion identified. Minimal densities in the lung bases likely represent scarring. There is no evidence of acute airspace consolidation, edema, or pneumothorax. No acute osseous abnormality is identified. Right upper quadrant abdominal surgical clips are noted.  IMPRESSION: No active cardiopulmonary disease.   Electronically Signed   By: Logan Bores   On: 07/20/2014 13:08     MDM   1. Acute bronchitis due to other specified organisms   2. Cough    zithromax Use albuterol  nebulizer qid. See Dr. Ileene Rubens for recheck next week.     Elizabeth, PA-C 07/20/14 1322

## 2014-07-20 NOTE — ED Notes (Signed)
Pt c/o cough x1 month. States last night cough worsened with productive brown mucous.

## 2014-07-22 ENCOUNTER — Encounter: Payer: Self-pay | Admitting: Emergency Medicine

## 2014-07-29 DIAGNOSIS — J438 Other emphysema: Secondary | ICD-10-CM | POA: Diagnosis not present

## 2014-08-02 ENCOUNTER — Ambulatory Visit: Payer: Self-pay | Admitting: Family Medicine

## 2014-08-02 ENCOUNTER — Ambulatory Visit (INDEPENDENT_AMBULATORY_CARE_PROVIDER_SITE_OTHER): Payer: Medicare Other | Admitting: Family Medicine

## 2014-08-02 ENCOUNTER — Encounter: Payer: Self-pay | Admitting: Family Medicine

## 2014-08-02 VITALS — BP 105/62 | HR 93 | Wt 126.0 lb

## 2014-08-02 DIAGNOSIS — N189 Chronic kidney disease, unspecified: Secondary | ICD-10-CM | POA: Diagnosis not present

## 2014-08-02 DIAGNOSIS — J449 Chronic obstructive pulmonary disease, unspecified: Secondary | ICD-10-CM

## 2014-08-02 DIAGNOSIS — E1122 Type 2 diabetes mellitus with diabetic chronic kidney disease: Secondary | ICD-10-CM

## 2014-08-02 MED ORDER — GLIPIZIDE-METFORMIN HCL 5-500 MG PO TABS: ORAL_TABLET | ORAL | Status: DC

## 2014-08-02 NOTE — Progress Notes (Signed)
CC: Mark Cooley is a 81 y.o. male is here for Diabetes   Subjective: HPI:  2 weeks ago patient was experiencing increased sputum production and shortness of breath. He was seen by our urgent care center and diagnosed with bronchitis prescribed azithromycin after an x-ray did not show any signs of pneumonia. He tells me within a few days he was feeling better further described as cough was back to normal and no longer short of breath. He's only been using his nebulizer 2 times a day and states it only provides him with a few hours of wheezing resolution. He uses Advair on a daily basis as prescribed along with Spiriva. Denies fevers, chills, nor blood and sputum  Follow-up type 2 diabetes: Continues to take glipizide/metformin 2 tablets twice a day. Tries to keep carbohydrate intake to a minimum. No polyuria polyphagia nor polydipsia. No poorly healing wounds. No outside blood sugars to report.   Review Of Systems Outlined In HPI  Past Medical History  Diagnosis Date  . Heart disease   . Hypertension   . Hyperlipidemia   . Diabetes   . Asthma with COPD   . CAD (coronary artery disease)   . Skin cancer   . Hyperlipidemia LDL goal < 70 05/10/2012  . Essential hypertension 05/10/2012  . BPH (benign prostatic hyperplasia) 05/10/2012  . Coronary artery disease 05/10/2012  . COPD (chronic obstructive pulmonary disease) 05/10/2012  . AAA (abdominal aortic aneurysm) 05/10/2012  . Type 2 diabetes mellitus 05/10/2012    Past Surgical History  Procedure Laterality Date  . Eye transplant    . Remove gallstones     Family History  Problem Relation Age of Onset  . Hypertension Father     History   Social History  . Marital Status: Married    Spouse Name: N/A    Number of Children: N/A  . Years of Education: N/A   Occupational History  . Not on file.   Social History Main Topics  . Smoking status: Former Smoker    Types: Cigarettes  . Smokeless tobacco: Never Used     Comment:  quit 25 years ago  . Alcohol Use: No  . Drug Use: No  . Sexual Activity: Not on file   Other Topics Concern  . Not on file   Social History Narrative     Objective: BP 105/62 mmHg  Pulse 93  Wt 126 lb (57.153 kg)  General: Alert and Oriented, No Acute Distress HEENT: Pupils equal, round, reactive to light. Conjunctivae clear.  Moist mucous membranes pharynx unremarkable Lungs: Mild central rhonchi and wheezing no signs of consolidation or rales. Comfortable work of breathing Cardiac: Regular rate and rhythm. Normal S1/S2.  No murmurs, rubs, nor gallops.   Extremities: No peripheral edema.  Strong peripheral pulses.  Mental Status: No depression, anxiety, nor agitation. Skin: Warm and dry.  Assessment & Plan: Kyrian was seen today for diabetes.  Diagnoses and associated orders for this visit:  Chronic obstructive pulmonary disease, unspecified COPD, unspecified chronic bronchitis type  Type 2 diabetes mellitus with diabetic chronic kidney disease - POCT HgB A1C - glipiZIDE-metformin (METAGLIP) 5-500 MG per tablet; TAKE TWO TABLETS BY MOUTH EVERY MORNING AND TWO TABLET BY MOUTH EVERY EVENING     COPD: Back to baseline, recommended that he use albuterol nebulizer more frequently, for example first thing in the morning and at lunchtime and dinnertime and then bedtime. Continue Spiriva and Advair daily   type 2 diabetes: A1c 7.3, controlled for  this geriatric patient. Continue glipizide/metformin 2 tabs twice a day.  Return in about 3 months (around 11/01/2014).

## 2014-08-19 ENCOUNTER — Other Ambulatory Visit: Payer: Self-pay | Admitting: Family Medicine

## 2014-08-22 DIAGNOSIS — L84 Corns and callosities: Secondary | ICD-10-CM | POA: Diagnosis not present

## 2014-08-22 DIAGNOSIS — E119 Type 2 diabetes mellitus without complications: Secondary | ICD-10-CM | POA: Diagnosis not present

## 2014-08-22 DIAGNOSIS — L602 Onychogryphosis: Secondary | ICD-10-CM | POA: Diagnosis not present

## 2014-08-24 ENCOUNTER — Telehealth: Payer: Self-pay | Admitting: Family Medicine

## 2014-08-24 ENCOUNTER — Ambulatory Visit: Payer: Medicare Other | Admitting: Family Medicine

## 2014-08-24 DIAGNOSIS — R413 Other amnesia: Secondary | ICD-10-CM

## 2014-08-24 DIAGNOSIS — G4700 Insomnia, unspecified: Secondary | ICD-10-CM

## 2014-08-24 DIAGNOSIS — E1122 Type 2 diabetes mellitus with diabetic chronic kidney disease: Secondary | ICD-10-CM

## 2014-08-24 DIAGNOSIS — J449 Chronic obstructive pulmonary disease, unspecified: Secondary | ICD-10-CM

## 2014-08-24 MED ORDER — GLIPIZIDE-METFORMIN HCL 5-500 MG PO TABS: ORAL_TABLET | ORAL | Status: DC

## 2014-08-24 MED ORDER — RIVASTIGMINE 9.5 MG/24HR TD PT24: 9.5000 mg | MEDICATED_PATCH | Freq: Every day | TRANSDERMAL | Status: AC

## 2014-08-24 MED ORDER — TRAZODONE HCL 50 MG PO TABS: 25.0000 mg | ORAL_TABLET | Freq: Every evening | ORAL | Status: DC | PRN

## 2014-08-24 MED ORDER — MEMANTINE HCL 10 MG PO TABS: ORAL_TABLET | ORAL | Status: AC

## 2014-08-24 MED ORDER — ALBUTEROL SULFATE HFA 108 (90 BASE) MCG/ACT IN AERS: 2.0000 | INHALATION_SPRAY | Freq: Four times a day (QID) | RESPIRATORY_TRACT | Status: AC | PRN

## 2014-08-24 MED ORDER — AZELASTINE HCL 0.05 % OP SOLN: OPHTHALMIC | Status: DC

## 2014-08-24 MED ORDER — TIOTROPIUM BROMIDE MONOHYDRATE 2.5 MCG/ACT IN AERS: INHALATION_SPRAY | RESPIRATORY_TRACT | Status: AC

## 2014-08-24 MED ORDER — PAROXETINE HCL 20 MG PO TABS: 20.0000 mg | ORAL_TABLET | Freq: Every day | ORAL | Status: DC

## 2014-08-24 MED ORDER — FLUTICASONE-SALMETEROL 500-50 MCG/DOSE IN AEPB: 1.0000 | INHALATION_SPRAY | Freq: Two times a day (BID) | RESPIRATORY_TRACT | Status: DC

## 2014-08-24 MED ORDER — ROSUVASTATIN CALCIUM 20 MG PO TABS: ORAL_TABLET | ORAL | Status: DC

## 2014-08-24 NOTE — Telephone Encounter (Signed)
Med refills to [facility]

## 2014-08-29 DIAGNOSIS — J438 Other emphysema: Secondary | ICD-10-CM | POA: Diagnosis not present

## 2014-08-31 DIAGNOSIS — D1801 Hemangioma of skin and subcutaneous tissue: Secondary | ICD-10-CM | POA: Diagnosis not present

## 2014-08-31 DIAGNOSIS — L820 Inflamed seborrheic keratosis: Secondary | ICD-10-CM | POA: Diagnosis not present

## 2014-08-31 DIAGNOSIS — D485 Neoplasm of uncertain behavior of skin: Secondary | ICD-10-CM | POA: Diagnosis not present

## 2014-09-26 ENCOUNTER — Telehealth: Payer: Self-pay | Admitting: *Deleted

## 2014-09-26 MED ORDER — OLOPATADINE HCL 0.1 % OP SOLN: 1.0000 [drp] | Freq: Two times a day (BID) | OPHTHALMIC | Status: DC

## 2014-09-26 NOTE — Telephone Encounter (Signed)
Daughter aware.

## 2014-09-26 NOTE — Telephone Encounter (Signed)
Daughter states the allergy eye drops azelestine are now too expensive since it must have changed on the insurance formulary. She would like another medication sent in

## 2014-09-26 NOTE — Telephone Encounter (Signed)
Patanol sent to [facility] in [place]

## 2014-09-27 DIAGNOSIS — J438 Other emphysema: Secondary | ICD-10-CM | POA: Diagnosis not present

## 2014-09-30 ENCOUNTER — Other Ambulatory Visit: Payer: Self-pay | Admitting: Family Medicine

## 2014-10-04 ENCOUNTER — Other Ambulatory Visit: Payer: Self-pay | Admitting: Family Medicine

## 2014-10-06 ENCOUNTER — Ambulatory Visit (INDEPENDENT_AMBULATORY_CARE_PROVIDER_SITE_OTHER): Payer: Medicare Other | Admitting: Family Medicine

## 2014-10-06 ENCOUNTER — Encounter: Payer: Self-pay | Admitting: Family Medicine

## 2014-10-06 VITALS — BP 132/79 | HR 75 | Wt 126.0 lb

## 2014-10-06 DIAGNOSIS — R454 Irritability and anger: Secondary | ICD-10-CM

## 2014-10-06 DIAGNOSIS — G4700 Insomnia, unspecified: Secondary | ICD-10-CM

## 2014-10-06 DIAGNOSIS — R609 Edema, unspecified: Secondary | ICD-10-CM

## 2014-10-06 DIAGNOSIS — L853 Xerosis cutis: Secondary | ICD-10-CM

## 2014-10-06 DIAGNOSIS — J449 Chronic obstructive pulmonary disease, unspecified: Secondary | ICD-10-CM | POA: Diagnosis not present

## 2014-10-06 MED ORDER — TRAZODONE HCL 50 MG PO TABS: 75.0000 mg | ORAL_TABLET | Freq: Every evening | ORAL | Status: DC | PRN

## 2014-10-06 MED ORDER — AMBULATORY NON FORMULARY MEDICATION: Status: AC

## 2014-10-06 NOTE — Progress Notes (Signed)
CC: Mark Cooley is a 81 y.o. male is here for Rash   Subjective: HPI:  Follow-up insomnia: Has been taking 50 mg of trazodone on a nightly basis. He has no difficulty falling asleep or staying asleep now. No new episodes of wandering. Patient, wife, daughters are very happy with this medication without any known side effects  Requesting new prescription for compression stockings that he wears on both lower extremities to control edema. Provided he wears this daily he denies any swelling of the lower extremity is more edema elsewhere.  Follow-up COPD: One of his daughters was worried about using an Advair dose of 500-50 so he has been using his former lower dose.both daughters have noticed that he is out of breath with walking across the room or down a short hallway. Patient denies any shortness of breath whatsoever. Wife also comments that he is wheezing more often than normal.he denies cough or chest discomfort  Wife is noted that is much more irritable over the past 2 or 3 weeks. For the most part he is nice sweet and loving however one or 2 times a day he'll have an outburst where he cusses over something simple. Wife denies any feeling that she is physically threatened.   Family notices that he's been scratching his head throughout the day. Patient denies any itching or discomfort. No interventions as of yet. He cleans his head by using soap on a daily basis.   Review Of Systems Outlined In HPI  Past Medical History  Diagnosis Date  . Heart disease   . Hypertension   . Hyperlipidemia   . Diabetes   . Asthma with COPD   . CAD (coronary artery disease)   . Skin cancer   . Hyperlipidemia LDL goal < 70 05/10/2012  . Essential hypertension 05/10/2012  . BPH (benign prostatic hyperplasia) 05/10/2012  . Coronary artery disease 05/10/2012  . COPD (chronic obstructive pulmonary disease) 05/10/2012  . AAA (abdominal aortic aneurysm) 05/10/2012  . Type 2 diabetes mellitus 05/10/2012     Past Surgical History  Procedure Laterality Date  . Eye transplant    . Remove gallstones     Family History  Problem Relation Age of Onset  . Hypertension Father     History   Social History  . Marital Status: Married    Spouse Name: N/A  . Number of Children: N/A  . Years of Education: N/A   Occupational History  . Not on file.   Social History Main Topics  . Smoking status: Former Smoker    Types: Cigarettes  . Smokeless tobacco: Never Used     Comment: quit 25 years ago  . Alcohol Use: No  . Drug Use: No  . Sexual Activity: Not on file   Other Topics Concern  . Not on file   Social History Narrative     Objective: BP 132/79 mmHg  Pulse 75  Wt 126 lb (57.153 kg)  SpO2 96%  General: Alert and Oriented, No Acute Distress HEENT: Pupils equal, round, reactive to light. Conjunctivae moist mucous membranes Lungs: Clear to auscultation bilaterally, no wheezing/ronchi/rales.  Comfortable work of breathing. Good air movement. Cardiac: Regular rate and rhythm. Normal S1/S2.  No murmurs, rubs, nor gallops.   Extremities: No peripheral edema.  Strong peripheral pulses.  Mental Status: No depression, anxiety, nor agitation. Skin: Warm and dry.scant flaking from the top of the crown  Assessment & Plan: Mark Cooley was seen today for rash.  Diagnoses and all orders for  this visit:  Insomnia Orders: -     traZODone (DESYREL) 50 MG tablet; Take 1.5 tablets (75 mg total) by mouth at bedtime as needed for sleep.  Edema  Chronic obstructive pulmonary disease, unspecified COPD, unspecified chronic bronchitis type  Irritability  Dry skin  Other orders -     AMBULATORY NON FORMULARY MEDICATION; Knee high compression stockings at a pressure of 20-16mmHg to be worn daily. Fit to size. Dx Edema R60.9   Insomnia: Controlled continue trazodone however increasing the dose to see if this will help prevent irritability during the daytime. Edema: Controlled continue  compression stockings COPD: Uncontrolled I've encouraged him to increase his daily regimen of Advair to the 500-50 regimen I provided him with. He has an appointment with his urologist later this month for second opinion Right skin: Discussed using Dove soap or over-the-counter moisturizers to prevent scratching and itching.  Return if symptoms worsen or fail to improve.

## 2014-10-14 ENCOUNTER — Other Ambulatory Visit: Payer: Self-pay | Admitting: Family Medicine

## 2014-10-16 ENCOUNTER — Other Ambulatory Visit: Payer: Self-pay | Admitting: Family Medicine

## 2014-10-24 DIAGNOSIS — J449 Chronic obstructive pulmonary disease, unspecified: Secondary | ICD-10-CM | POA: Diagnosis not present

## 2014-10-24 DIAGNOSIS — R0602 Shortness of breath: Secondary | ICD-10-CM | POA: Diagnosis not present

## 2014-10-24 DIAGNOSIS — R0902 Hypoxemia: Secondary | ICD-10-CM | POA: Diagnosis not present

## 2014-10-28 DIAGNOSIS — J438 Other emphysema: Secondary | ICD-10-CM | POA: Diagnosis not present

## 2014-11-06 ENCOUNTER — Ambulatory Visit (INDEPENDENT_AMBULATORY_CARE_PROVIDER_SITE_OTHER): Payer: Medicare Other | Admitting: Family Medicine

## 2014-11-06 ENCOUNTER — Encounter: Payer: Self-pay | Admitting: Family Medicine

## 2014-11-06 ENCOUNTER — Other Ambulatory Visit: Payer: Self-pay | Admitting: *Deleted

## 2014-11-06 VITALS — BP 103/62 | HR 65 | Wt 123.0 lb

## 2014-11-06 DIAGNOSIS — R5381 Other malaise: Secondary | ICD-10-CM | POA: Diagnosis not present

## 2014-11-06 DIAGNOSIS — J449 Chronic obstructive pulmonary disease, unspecified: Secondary | ICD-10-CM

## 2014-11-06 DIAGNOSIS — E1122 Type 2 diabetes mellitus with diabetic chronic kidney disease: Secondary | ICD-10-CM | POA: Diagnosis not present

## 2014-11-06 DIAGNOSIS — R454 Irritability and anger: Secondary | ICD-10-CM

## 2014-11-06 DIAGNOSIS — N189 Chronic kidney disease, unspecified: Secondary | ICD-10-CM | POA: Diagnosis not present

## 2014-11-06 DIAGNOSIS — R29818 Other symptoms and signs involving the nervous system: Secondary | ICD-10-CM

## 2014-11-06 DIAGNOSIS — R2689 Other abnormalities of gait and mobility: Secondary | ICD-10-CM

## 2014-11-06 LAB — POCT GLYCOSYLATED HEMOGLOBIN (HGB A1C): HEMOGLOBIN A1C: 7.3

## 2014-11-06 MED ORDER — NITROGLYCERIN 0.4 MG SL SUBL: 0.4000 mg | SUBLINGUAL_TABLET | SUBLINGUAL | Status: AC | PRN

## 2014-11-06 MED ORDER — FLUTICASONE-SALMETEROL 500-50 MCG/DOSE IN AEPB: 1.0000 | INHALATION_SPRAY | Freq: Two times a day (BID) | RESPIRATORY_TRACT | Status: DC

## 2014-11-06 NOTE — Progress Notes (Signed)
CC: Mark Cooley is a 81 y.o. male is here for Diabetes   Subjective: HPI:  Type 2 diabetes: Continues to take 2 tablets of glipizide/metformin every morning and another 2 tablets in the evening. blood sugars to report. he denies polyuria polyphagia polydipsia nor poorly healing wounds.  Follow-up COPD: Admits to not using oxygen with activity but only with rest and sleep. He gets short of breath with walking more than 50 yards. Uses albuterol about once a day for shortness of breath. Continues on Advair 500-50, two puffs twice a day. Denies any cough, wheezing, nor chest discomfort  Family believes that he has been  much less irritable since increasing trazodone to 75 mg nightly however it's causing him to be overly sedated to the point where he is hard to arouse. Since going down on the dose to 50 mg nightly he's no longer having this profound side effects.  One of his daughters has noticed that he seems much more unstable on his feet for the past month and has not had any falls or close calls but has been mentioning that his balance seems to be off. He reports some weakness in his legs but tells me it's been like this for a while and he feels stronger overall.   Review Of Systems Outlined In HPI  Past Medical History  Diagnosis Date  . Heart disease   . Hypertension   . Hyperlipidemia   . Diabetes   . Asthma with COPD   . CAD (coronary artery disease)   . Skin cancer   . Hyperlipidemia LDL goal < 70 05/10/2012  . Essential hypertension 05/10/2012  . BPH (benign prostatic hyperplasia) 05/10/2012  . Coronary artery disease 05/10/2012  . COPD (chronic obstructive pulmonary disease) 05/10/2012  . AAA (abdominal aortic aneurysm) 05/10/2012  . Type 2 diabetes mellitus 05/10/2012    Past Surgical History  Procedure Laterality Date  . Eye transplant    . Remove gallstones     Family History  Problem Relation Age of Onset  . Hypertension Father     History   Social History  .  Marital Status: Married    Spouse Name: N/A  . Number of Children: N/A  . Years of Education: N/A   Occupational History  . Not on file.   Social History Main Topics  . Smoking status: Former Smoker    Types: Cigarettes  . Smokeless tobacco: Never Used     Comment: quit 25 years ago  . Alcohol Use: No  . Drug Use: No  . Sexual Activity: Not on file   Other Topics Concern  . Not on file   Social History Narrative     Objective: BP 103/62 mmHg  Pulse 65  Wt 123 lb (55.792 kg)  General: Alert and Oriented, No Acute Distress HEENT: Pupils equal, round, reactive to light. Conjunctivae clear.  Moist because membranes Lungs: Comfortable work of breathing with trace end expiratory wheeze in the left lung lobe lung field, no rhonchi or rales. No signs of consolidation. Cardiac: Regular rate and rhythm. Normal S1/S2.  No murmurs, rubs, nor gallops.   Abdomen: Soft nontender Extremities: No peripheral edema.  Strong peripheral pulses. Full range of motion and strength in both lower extremity. Shuffling gait without rigidity requiring assistance from family members to get up and down from the examination table Mental Status: No depression, anxiety, nor agitation. Skin: Warm and dry.  Assessment & Plan: Mark Cooley was seen today for diabetes.  Diagnoses and all  orders for this visit:  Type 2 diabetes mellitus with diabetic chronic kidney disease Orders: -     POCT HgB A1C  Chronic obstructive pulmonary disease, unspecified COPD, unspecified chronic bronchitis type Orders: -     Ambulatory referral to Pulmonology  Irritability  Physical deconditioning Orders: -     Ambulatory referral to [place] problem Orders: -     Ambulatory referral to [place]  Other orders -     nitroGLYCERIN (NITROSTAT) 0.4 MG SL tablet; Place 1 tablet (0.4 mg total) under the tongue every 5 (five) minutes as needed for chest pain.   Type 2 diabetes: A1c of 7.3, at goal for this  individual but I would like to keep below 8.0. Continue current glipizide and metformin regimen. COPD: Controlled, again I've encouraged him to use oxygen around the clock not just with rest. Continue Advair and Spiriva, one of his daughters would like to establish with pulmonology within our health systems a referral has been placed Irritability: Improved no further intervention other than trazodone nightly For his balance concerns will arrange home physical therapy to help with strengthening exercises and talk about assistive devices.  Return in about 3 months (around 02/06/2015).

## 2014-11-17 ENCOUNTER — Other Ambulatory Visit: Payer: Self-pay | Admitting: Family Medicine

## 2014-11-23 DIAGNOSIS — L84 Corns and callosities: Secondary | ICD-10-CM | POA: Diagnosis not present

## 2014-11-23 DIAGNOSIS — L602 Onychogryphosis: Secondary | ICD-10-CM | POA: Diagnosis not present

## 2014-11-23 DIAGNOSIS — L600 Ingrowing nail: Secondary | ICD-10-CM | POA: Diagnosis not present

## 2014-11-23 DIAGNOSIS — E119 Type 2 diabetes mellitus without complications: Secondary | ICD-10-CM | POA: Diagnosis not present

## 2014-11-27 DIAGNOSIS — J438 Other emphysema: Secondary | ICD-10-CM | POA: Diagnosis not present

## 2014-12-27 ENCOUNTER — Encounter: Payer: Self-pay | Admitting: Emergency Medicine

## 2014-12-27 ENCOUNTER — Emergency Department
Admission: EM | Admit: 2014-12-27 | Discharge: 2014-12-27 | Disposition: A | Discharge status: Home / Self Care | Payer: Medicare Other | Source: Home / Self Care | Attending: Emergency Medicine | Admitting: Emergency Medicine

## 2014-12-27 DIAGNOSIS — N390 Urinary tract infection, site not specified: Secondary | ICD-10-CM | POA: Diagnosis not present

## 2014-12-27 DIAGNOSIS — R319 Hematuria, unspecified: Secondary | ICD-10-CM | POA: Diagnosis not present

## 2014-12-27 LAB — POCT URINALYSIS DIP (MANUAL ENTRY)
BILIRUBIN UA: NEGATIVE
GLUCOSE UA: NEGATIVE
Ketones, POC UA: NEGATIVE
Leukocytes, UA: NEGATIVE
NITRITE UA: NEGATIVE
Protein Ur, POC: NEGATIVE
Spec Grav, UA: 1.01 (ref 1.005–1.03)
UROBILINOGEN UA: NEGATIVE (ref 0–1)
pH, UA: 5.5 (ref 5–8)

## 2014-12-27 MED ORDER — AMOXICILLIN 500 MG PO CAPS: 500.0000 mg | ORAL_CAPSULE | Freq: Three times a day (TID) | ORAL | Status: DC

## 2014-12-27 NOTE — ED Notes (Signed)
Pt daughter c/o tabius having frequent urination x3 days. States also wants his glucose checked.

## 2014-12-27 NOTE — ED Provider Notes (Signed)
CSN: [id number]     Arrival date & time 12/27/14  1403 History   First MD Initiated Contact with Patient 12/27/14 1418     Chief Complaint  Patient presents with  . Urinary Frequency   (Consider location/radiation/quality/duration/timing/severity/associated sxs/prior Treatment) Patient is a 81 y.o. male presenting with frequency. The history is provided by the patient. No language interpreter was used.  Urinary Frequency This is a new problem. The problem occurs constantly. The problem has been rapidly worsening. Pertinent negatives include no abdominal pain and no headaches. Nothing aggravates the symptoms. Nothing relieves the symptoms. He has tried nothing for the symptoms. The treatment provided no relief.   Daughter is concerned that pt has a uti.  He has had frequent urination.  Pt has a history of dementia which gets worse with uti's.  Daughter reports some increase in symtoms.  Daughter reports last time he had a uti it showed blood on initial eval Past Medical History  Diagnosis Date  . Heart disease   . Hypertension   . Hyperlipidemia   . Diabetes   . Asthma with COPD   . CAD (coronary artery disease)   . Skin cancer   . Hyperlipidemia LDL goal < 70 05/10/2012  . Essential hypertension 05/10/2012  . BPH (benign prostatic hyperplasia) 05/10/2012  . Coronary artery disease 05/10/2012  . COPD (chronic obstructive pulmonary disease) 05/10/2012  . AAA (abdominal aortic aneurysm) 05/10/2012  . Type 2 diabetes mellitus 05/10/2012   Past Surgical History  Procedure Laterality Date  . Eye transplant    . Remove gallstones     Family History  Problem Relation Age of Onset  . Hypertension Father    History  Substance Use Topics  . Smoking status: Former Smoker    Types: Cigarettes  . Smokeless tobacco: Never Used     Comment: quit 25 years ago  . Alcohol Use: No    Review of Systems  Gastrointestinal: Negative for abdominal pain.  Genitourinary: Positive for frequency.   Neurological: Negative for headaches.  All other systems reviewed and are negative.   Allergies  Budesonide and Donepezil  Home Medications   Prior to Admission medications   Medication Sig Start Date End Date Taking? Authorizing Provider  albuterol (PROVENTIL HFA;VENTOLIN HFA) 108 (90 BASE) MCG/ACT inhaler Inhale 2 puffs into the lungs every 6 (six) hours as needed for wheezing or shortness of breath. 08/24/14   Marcial Pacas, DO  ALPRAZolam (XANAX) 0.5 MG tablet Take 0.5 mg by mouth at bedtime as needed for anxiety.    Historical Provider, MD  AMBULATORY NON Zap filled oxygen tank Dx COPD 496 10/29/12   Marcial Pacas, DO  AMBULATORY NON FORMULARY MEDICATION Nebulizer machine  RK:[id number] 11/01/12   Sean Hommel, DO  AMBULATORY NON FORMULARY MEDICATION Knee high compression stockings at a pressure of 20-90mmHg to be worn daily. Fit to size. Dx Edema R60.9 10/06/14   Marcial Pacas, DO  amoxicillin (AMOXIL) 500 MG capsule Take 1 capsule (500 mg total) by mouth 3 (three) times daily. 12/27/14   Fransico Meadow, PA-C  aspirin 81 MG tablet Take 81 mg by mouth daily.    Historical Provider, MD  Calcium Carb-Cholecalciferol (CALCIUM + D3) 600-200 MG-UNIT TABS Take by mouth daily.    Historical Provider, MD  CRESTOR 20 MG tablet TAKE 1 TABLET (20 MG TOTAL) BY MOUTH DAILY. 11/17/14   Sean Hommel, DO  diphenhydramine-acetaminophen (TYLENOL PM) 25-500 MG TABS Take 1 tablet by mouth at bedtime as  needed.    Historical Provider, MD  fluoruracil [facility]) 0.5 % cream Apply topically daily.    Historical Provider, MD  fluticasone (FLONASE) 50 MCG/ACT nasal spray Place 2 sprays into both nostrils daily. 05/27/13   Jade L Breeback, PA-C  Fluticasone-Salmeterol (ADVAIR DISKUS) 500-50 MCG/DOSE AEPB Inhale 1 puff into the lungs 2 (two) times daily. 11/06/14   Marcial Pacas, DO  glipiZIDE-metformin (METAGLIP) 5-500 MG per tablet Take 2 tablets by mouth 2 (two) times daily before a meal. 11/06/14   Sean  Hommel, DO  Glucose Blood (TRUETRACK TEST VI)     Historical Provider, MD  memantine (NAMENDA) 10 MG tablet TAKE 1 TABLET (10 MG TOTAL) BY MOUTH 2 (TWO) TIMES DAILY. 08/24/14   Marcial Pacas, DO  nitroGLYCERIN (NITROSTAT) 0.4 MG SL tablet Place 1 tablet (0.4 mg total) under the tongue every 5 (five) minutes as needed for chest pain. 11/06/14   Sean Hommel, DO  olopatadine (PATANOL) 0.1 % ophthalmic solution Place 1 drop into both eyes 2 (two) times daily. 09/26/14   Marcial Pacas, DO  Omega-3 Fatty Acids (FISH OIL) 1000 MG CAPS Take by mouth.    Historical Provider, MD  Oxygen Permeable Lens Products SOLN by Does not apply route.    Historical Provider, MD  PARoxetine (PAXIL) 20 MG tablet Take 1 tablet (20 mg total) by mouth daily. 08/24/14   Marcial Pacas, DO  rivastigmine (EXELON) 9.5 mg/24hr Place 1 patch (9.5 mg total) onto the skin daily. 08/24/14   Marcial Pacas, DO  Tiotropium Bromide Monohydrate (SPIRIVA RESPIMAT) 2.5 MCG/ACT AERS One inhalation a day. 08/24/14   Marcial Pacas, DO  traZODone (DESYREL) 50 MG tablet Take 1.5 tablets (75 mg total) by mouth at bedtime as needed for sleep. 10/06/14   Sean Hommel, DO   BP 132/70 mmHg  Pulse 80  SpO2 96% Physical Exam  Constitutional: He appears well-developed and well-nourished.  HENT:  Head: Normocephalic.  Right Ear: External ear normal.  Mouth/Throat: Oropharynx is clear and moist.  Eyes: EOM are normal. Pupils are equal, round, and reactive to light.  Neck: Normal range of motion.  Cardiovascular: Normal rate and normal heart sounds.   Pulmonary/Chest: Effort normal.  Abdominal: He exhibits no distension.  Musculoskeletal: Normal range of motion.  Neurological: He is alert. He has normal reflexes.  Skin: Skin is warm.  Psychiatric: He has a normal mood and affect.  Nursing note and vitals reviewed.   ED Course  Procedures (including critical care time) Labs Review Labs Reviewed  URINE CULTURE    Imaging Review No results found.   MDM  no problems caring for pt.   I will culture urine.  Rx for amoxicillian (all urine cultures that I have are negative for growth in the past)    1. Hematuria    AVS Follow up with dr. Charlotte Crumb, PA-C 12/27/14 1433

## 2014-12-27 NOTE — Discharge Instructions (Signed)

## 2014-12-28 ENCOUNTER — Institutional Professional Consult (permissible substitution): Payer: Medicare Other | Admitting: Pulmonary Disease

## 2014-12-28 ENCOUNTER — Ambulatory Visit (INDEPENDENT_AMBULATORY_CARE_PROVIDER_SITE_OTHER): Payer: Medicare Other | Admitting: Pulmonary Disease

## 2014-12-28 ENCOUNTER — Encounter: Payer: Self-pay | Admitting: Pulmonary Disease

## 2014-12-28 VITALS — BP 112/68 | HR 79 | Temp 97.5°F | Ht 64.0 in | Wt 125.0 lb

## 2014-12-28 DIAGNOSIS — J438 Other emphysema: Secondary | ICD-10-CM | POA: Diagnosis not present

## 2014-12-28 DIAGNOSIS — J449 Chronic obstructive pulmonary disease, unspecified: Secondary | ICD-10-CM

## 2014-12-28 NOTE — Assessment & Plan Note (Addendum)
You have COPD Stay on advair & spiriva Use albuterol as needed for rescue Use oxygen during sleep We discussed signs and symptoms of COPD exacerbation-and laid out plan for her daughter. I do not think he would be able to participate in pulmonary rehabilitation may benefit from home health PT evaluation

## 2014-12-28 NOTE — Progress Notes (Signed)
Subjective:    Patient ID: Mark Cooley, male    DOB: Aug 26, 1922, 81 y.o.   MRN: [id number]  HPI   Chief Complaint  Patient presents with  . Advice Only    Referred by Dr. Nicoletta Ba; COPD; Dr. Percell Miller of [facility] was his doctor, changing to [place]. Should be using Oxygen while walking around, but will not wear his oxygen. CAT Score: 31   77 year old remote smoker presents for evaluation of COPD. He used to follow-up with [place] [facility] and is now transferring his care to [place]. Has been diagnosed with moderate COPD, spirometry 01/2014 showed a ratio 45 FEV1 of 46% and FVC of 60%. He smoked 3-4 packs per day until he quit in 85-about 75 pack years He is maintained on nocturnal oxygen, his daughter accompanies. He has moderate dementia and is now started wandering. He uses pro-air for rescue and albuterol nebs daily. He has 1-2 exacerbations every year, high-dose prednisone has caused him delirium but he seems to tolerate low-dose okay. *He feels that he is unsteady on his feet. He is able to take Advair and Spiriva. Out of breath on walking long distances  Past Medical History  Diagnosis Date  . Heart disease   . Hypertension   . Hyperlipidemia   . Diabetes   . Asthma with COPD   . CAD (coronary artery disease)   . Skin cancer   . Hyperlipidemia LDL goal < 70 05/10/2012  . Essential hypertension 05/10/2012  . BPH (benign prostatic hyperplasia) 05/10/2012  . Coronary artery disease 05/10/2012  . COPD (chronic obstructive pulmonary disease) 05/10/2012  . AAA (abdominal aortic aneurysm) 05/10/2012  . Type 2 diabetes mellitus 05/10/2012  . Alzheimer disease    Past Surgical History  Procedure Laterality Date  . Eye transplant    . Remove gallstones    . Abdominal aortic aneurysm repair    . Removed gall bladder      Allergies  Allergen Reactions  . Budesonide     Cough; D/c (Dr Percell Miller) lung md  . Donepezil     Diarrhea    History   Social History  . Marital  Status: Married    Spouse Name: N/A  . Number of Children: N/A  . Years of Education: N/A   Occupational History  . Not on file.   Social History Main Topics  . Smoking status: Former Smoker -- 2.50 packs/day for 60 years    Types: Cigarettes    Quit date: 07/07/1989  . Smokeless tobacco: Never Used     Comment: quit 25 years ago  . Alcohol Use: No  . Drug Use: No  . Sexual Activity: Not on file   Other Topics Concern  . Not on file   Social History Narrative    Family History  Problem Relation Age of Onset  . Hypertension Father      Review of Systems  Constitutional: Negative for fever, chills, activity change, appetite change and unexpected weight change.  HENT: Negative for congestion, dental problem, postnasal drip, rhinorrhea, sneezing, sore throat, trouble swallowing and voice change.   Eyes: Negative for visual disturbance.  Respiratory: Negative for cough, choking and shortness of breath.   Cardiovascular: Negative for chest pain and leg swelling.  Gastrointestinal: Negative for nausea, vomiting and abdominal pain.  Genitourinary: Negative for difficulty urinating.  Musculoskeletal: Negative for arthralgias.  Skin: Negative for rash.  Psychiatric/Behavioral: Negative for behavioral problems and confusion.       Objective:  Physical Exam  Gen. Pleasant, well-nourished, in no distress, normal affect ENT - no lesions, no post nasal drip Neck: No JVD, no thyromegaly, no carotid bruits Lungs: no use of accessory muscles, no dullness to percussion, clear without rales or rhonchi  Cardiovascular: Rhythm regular, heart sounds  normal, no murmurs or gallops, no peripheral edema Abdomen: soft and non-tender, no hepatosplenomegaly, BS normal. Musculoskeletal: No deformities, no cyanosis or clubbing Neuro:  alert, non focal       Assessment & Plan:

## 2014-12-28 NOTE — Patient Instructions (Addendum)
You have COPD Stay on advair & spiriva Use albuterol as needed for rescue Use oxygen during sleep Call if unable to use inhalers Call if bad chest cold

## 2014-12-29 LAB — URINE CULTURE
Colony Count: NO GROWTH
Organism ID, Bacteria: NO GROWTH

## 2015-01-02 ENCOUNTER — Emergency Department
Admission: EM | Admit: 2015-01-02 | Discharge: 2015-01-02 | Disposition: A | Discharge status: Home / Self Care | Payer: Medicare Other | Source: Home / Self Care | Attending: Family Medicine | Admitting: Family Medicine

## 2015-01-02 ENCOUNTER — Encounter: Payer: Self-pay | Admitting: *Deleted

## 2015-01-02 DIAGNOSIS — H5789 Other specified disorders of eye and adnexa: Secondary | ICD-10-CM

## 2015-01-02 DIAGNOSIS — S0502XA Injury of conjunctiva and corneal abrasion without foreign body, left eye, initial encounter: Secondary | ICD-10-CM

## 2015-01-02 HISTORY — DX: Unspecified macular degeneration: H35.30

## 2015-01-02 MED ORDER — TETRACAINE HCL 0.5 % OP SOLN
2.0000 [drp] | Freq: Once | OPHTHALMIC | Status: AC
  Administered 2015-01-02: 2 [drp] via OPHTHALMIC

## 2015-01-02 MED ORDER — CIPROFLOXACIN HCL 0.3 % OP SOLN: 2.0000 [drp] | OPHTHALMIC | Status: DC

## 2015-01-02 NOTE — ED Provider Notes (Signed)
CSN: [id number]     Arrival date & time 01/02/15  1108 History   First MD Initiated Contact with Patient 01/02/15 1136     Chief Complaint  Patient presents with  . Eye Problem   (Consider location/radiation/quality/duration/timing/severity/associated sxs/prior Treatment) Patient is a 81 y.o. male presenting with eye problem. The history is provided by the patient and a relative. No language interpreter was used.  Eye Problem Location:  L eye Quality: eye redness and drooping. Severity:  Mild Onset quality:  Gradual Duration: 2-3 days. Timing:  Constant Progression:  Worsening Chronicity:  New Context: not burn, not chemical exposure, not contact lens problem, not direct trauma, not foreign body, not scratch and not smoke exposure   Relieved by:  None tried Worsened by:  Nothing tried Ineffective treatments:  None tried Associated symptoms: redness and tearing   Associated symptoms: no headaches, no itching and no photophobia    Pt brought in by daughter for further evaluation of Left eye redness and drooping for 2-3 days.  Daughter states pt was with her sister last week and also noticed slight redness to his left eye then but it has gradually worsened. Pt has poor eyesight at baseline secondary to macular degeneration.  Pt does use daily eye drops, Patanol for allergies but has not used today due to the worsening redness. No known injury. Pt denies itching, pain or worsening vision. No recent cough or congestion.   Past Medical History  Diagnosis Date  . Heart disease   . Hypertension   . Hyperlipidemia   . Diabetes   . Asthma with COPD   . CAD (coronary artery disease)   . Skin cancer   . Hyperlipidemia LDL goal < 70 05/10/2012  . Essential hypertension 05/10/2012  . BPH (benign prostatic hyperplasia) 05/10/2012  . Coronary artery disease 05/10/2012  . COPD (chronic obstructive pulmonary disease) 05/10/2012  . AAA (abdominal aortic aneurysm) 05/10/2012  . Type 2 diabetes  mellitus 05/10/2012  . Alzheimer disease   . Macular degeneration    Past Surgical History  Procedure Laterality Date  . Eye transplant    . Remove gallstones    . Abdominal aortic aneurysm repair    . Removed gall bladder     Family History  Problem Relation Age of Onset  . Hypertension Father    History  Substance Use Topics  . Smoking status: Former Smoker -- 2.50 packs/day for 60 years    Types: Cigarettes    Quit date: 07/07/1989  . Smokeless tobacco: Never Used     Comment: quit 25 years ago  . Alcohol Use: No    Review of Systems  Constitutional: Negative for fever and chills.  HENT: Negative for congestion.   Eyes: Positive for redness. Negative for photophobia, pain, itching and visual disturbance.  Respiratory: Negative for cough.   Skin: Negative for rash.  Neurological: Negative for dizziness, light-headedness and headaches.    Allergies  Budesonide and Donepezil  Home Medications   Prior to Admission medications   Medication Sig Start Date End Date Taking? Authorizing Provider  albuterol (PROVENTIL HFA;VENTOLIN HFA) 108 (90 BASE) MCG/ACT inhaler Inhale 2 puffs into the lungs every 6 (six) hours as needed for wheezing or shortness of breath. 08/24/14   Marcial Pacas, DO  ALPRAZolam (XANAX) 0.5 MG tablet Take 0.5 mg by mouth at bedtime as needed for anxiety.    Historical Provider, MD  AMBULATORY NON [place] filled oxygen tank Dx COPD 496 10/29/12   Hilliard Clark  Hommel, DO  AMBULATORY NON FORMULARY MEDICATION Nebulizer machine  GB:TDVV 496 11/01/12   Sean Hommel, DO  AMBULATORY NON FORMULARY MEDICATION Knee high compression stockings at a pressure of 20-76mmHg to be worn daily. Fit to size. Dx Edema R60.9 10/06/14   Marcial Pacas, DO  amoxicillin (AMOXIL) 500 MG capsule Take 1 capsule (500 mg total) by mouth 3 (three) times daily. 12/27/14   Fransico Meadow, PA-C  aspirin 81 MG tablet Take 81 mg by mouth daily.    Historical Provider, MD  Calcium  Carb-Cholecalciferol (CALCIUM + D3) 600-200 MG-UNIT TABS Take by mouth daily.    Historical Provider, MD  ciprofloxacin (CILOXAN) 0.3 % ophthalmic solution Place 2 drops into the left eye every 4 (four) hours while awake. For the next 5 days. 01/02/15   Noland Fordyce, PA-C  CRESTOR 20 MG tablet TAKE 1 TABLET (20 MG TOTAL) BY MOUTH DAILY. 11/17/14   Sean Hommel, DO  diphenhydramine-acetaminophen (TYLENOL PM) 25-500 MG TABS Take 1 tablet by mouth at bedtime as needed.    Historical Provider, MD  fluoruracil [facility]) 0.5 % cream Apply topically daily.    Historical Provider, MD  fluticasone (FLONASE) 50 MCG/ACT nasal spray Place 2 sprays into both nostrils daily. 05/27/13   Jade L Breeback, PA-C  Fluticasone-Salmeterol (ADVAIR DISKUS) 500-50 MCG/DOSE AEPB Inhale 1 puff into the lungs 2 (two) times daily. 11/06/14   Marcial Pacas, DO  glipiZIDE-metformin (METAGLIP) 5-500 MG per tablet Take 2 tablets by mouth 2 (two) times daily before a meal. 11/06/14   Sean Hommel, DO  Glucose Blood (TRUETRACK TEST VI)     Historical Provider, MD  memantine (NAMENDA) 10 MG tablet TAKE 1 TABLET (10 MG TOTAL) BY MOUTH 2 (TWO) TIMES DAILY. 08/24/14   Marcial Pacas, DO  nitroGLYCERIN (NITROSTAT) 0.4 MG SL tablet Place 1 tablet (0.4 mg total) under the tongue every 5 (five) minutes as needed for chest pain. 11/06/14   Sean Hommel, DO  olopatadine (PATANOL) 0.1 % ophthalmic solution Place 1 drop into both eyes 2 (two) times daily. 09/26/14   Marcial Pacas, DO  Omega-3 Fatty Acids (FISH OIL) 1000 MG CAPS Take by mouth.    Historical Provider, MD  Oxygen Permeable Lens Products SOLN by Does not apply route.    Historical Provider, MD  PARoxetine (PAXIL) 20 MG tablet Take 1 tablet (20 mg total) by mouth daily. 08/24/14   Marcial Pacas, DO  rivastigmine (EXELON) 9.5 mg/24hr Place 1 patch (9.5 mg total) onto the skin daily. 08/24/14   Marcial Pacas, DO  Tiotropium Bromide Monohydrate (SPIRIVA RESPIMAT) 2.5 MCG/ACT AERS One inhalation a day. 08/24/14    Marcial Pacas, DO  traZODone (DESYREL) 50 MG tablet Take 1.5 tablets (75 mg total) by mouth at bedtime as needed for sleep. 10/06/14   Sean Hommel, DO   BP 129/60 mmHg  Pulse 82  Temp(Src) 97.7 F (36.5 C) (Oral)  Resp 16  Ht 5\' 5"  (1.651 m)  Wt 120 lb (54.432 kg)  BMI 19.97 kg/m2  SpO2 100% Physical Exam  Constitutional: He is oriented to person, place, and time. He appears well-developed and well-nourished.  HENT:  Head: Normocephalic and atraumatic.  Eyes: EOM and lids are normal. Pupils are equal, round, and reactive to light. Lids are everted and swept, no foreign bodies found. Left eye exhibits no chemosis and no discharge. No foreign body present in the left eye. Right conjunctiva is not injected. Left conjunctiva is injected.  Slit lamp exam:  The left eye shows corneal abrasion and fluorescein uptake. The left eye shows no corneal flare, no corneal ulcer, no foreign body, no hyphema and no hypopyon.  Neck: Normal range of motion.  Cardiovascular: Normal rate.   Pulmonary/Chest: Effort normal.  Musculoskeletal: Normal range of motion.  Neurological: He is alert and oriented to person, place, and time.  Skin: Skin is warm and dry.  Psychiatric: He has a normal mood and affect. His behavior is normal.  Nursing note and vitals reviewed.   ED Course  Procedures (including critical care time) Labs Review Labs Reviewed - No data to display  Imaging Review No results found.   MDM   1. Left corneal abrasion, initial encounter   2. Redness of left eye     Pt has redness to Left conjunctiva with fluorescin uptake to lateral aspect of eye.  No foreign bodies seen on exam. No evidence of periorbital cellulitis.  Will tx for corneal abrasion. Rx: ciprofloxacin ophthalmic drops. F/u with Dr. Ileene Rubens in 4-5 days for symptom recheck. Return precautions provided. Pt and daughter verbalized understanding and agreement with tx plan.     Noland Fordyce, PA-C 01/02/15 1213

## 2015-01-02 NOTE — ED Notes (Signed)
Pt c/o LT eye redness and drouping x 1 day. Denies injury, pain or itching. He has a history of macular degeneration.

## 2015-01-11 ENCOUNTER — Other Ambulatory Visit: Payer: Self-pay | Admitting: *Deleted

## 2015-01-11 DIAGNOSIS — G309 Alzheimer's disease, unspecified: Secondary | ICD-10-CM | POA: Diagnosis not present

## 2015-01-11 MED ORDER — GLIPIZIDE-METFORMIN HCL 5-500 MG PO TABS: 2.0000 | ORAL_TABLET | Freq: Two times a day (BID) | ORAL | Status: DC

## 2015-01-18 DIAGNOSIS — I10 Essential (primary) hypertension: Secondary | ICD-10-CM | POA: Diagnosis not present

## 2015-01-18 DIAGNOSIS — I2510 Atherosclerotic heart disease of native coronary artery without angina pectoris: Secondary | ICD-10-CM | POA: Diagnosis not present

## 2015-01-18 DIAGNOSIS — R609 Edema, unspecified: Secondary | ICD-10-CM | POA: Diagnosis not present

## 2015-01-27 DIAGNOSIS — J438 Other emphysema: Secondary | ICD-10-CM | POA: Diagnosis not present

## 2015-02-05 DIAGNOSIS — L570 Actinic keratosis: Secondary | ICD-10-CM | POA: Diagnosis not present

## 2015-02-05 DIAGNOSIS — L820 Inflamed seborrheic keratosis: Secondary | ICD-10-CM | POA: Diagnosis not present

## 2015-02-06 ENCOUNTER — Encounter: Payer: Self-pay | Admitting: Family Medicine

## 2015-02-06 ENCOUNTER — Ambulatory Visit (INDEPENDENT_AMBULATORY_CARE_PROVIDER_SITE_OTHER): Payer: Medicare Other | Admitting: Family Medicine

## 2015-02-06 VITALS — BP 101/58 | HR 113 | Wt 126.0 lb

## 2015-02-06 DIAGNOSIS — R454 Irritability and anger: Secondary | ICD-10-CM | POA: Diagnosis not present

## 2015-02-06 DIAGNOSIS — E1122 Type 2 diabetes mellitus with diabetic chronic kidney disease: Secondary | ICD-10-CM

## 2015-02-06 DIAGNOSIS — I10 Essential (primary) hypertension: Secondary | ICD-10-CM | POA: Diagnosis not present

## 2015-02-06 DIAGNOSIS — R319 Hematuria, unspecified: Secondary | ICD-10-CM | POA: Diagnosis not present

## 2015-02-06 DIAGNOSIS — N189 Chronic kidney disease, unspecified: Secondary | ICD-10-CM

## 2015-02-06 MED ORDER — PAROXETINE HCL 30 MG PO TABS: 30.0000 mg | ORAL_TABLET | Freq: Every day | ORAL | Status: DC

## 2015-02-06 NOTE — Progress Notes (Addendum)
CC: Mark Cooley is a 81 y.o. male is here for Diabetes   Subjective: HPI:  Follow-up type 2 diabetes: Continues to take glipizide/metformin daily basis without known side effects. No outside blood sugars to report. No polyuria polyphagia or polydipsia. Denies any decline in vision.  Follow-up essential hypertension: Currently not taking any antihypertensives medication. Denies any lightheadedness. Denies chest pain orthopnea nor peripheral edema. Occasionally has shortness of breath but resolved if he uses his albuterol 4 times a day.  Follow-up hematuria: since I saw him last he was given amoxicillin for urinary tract infection. Today he denies any pain with urination or incontinence.  Family notes he's been much more irritable lately and this is causing him to go out into the yard during during the day so he can get away from other family members, he believes that his wife and family members are the only thing causing his irritability. He enjoys being outside but family members are understandably concerned that his outside when it's almost 100 on a daily basis and he has difficulty walking more than a few feet due to chronic SOB.  Review Of Systems Outlined In HPI  Past Medical History  Diagnosis Date  . Heart disease   . Hypertension   . Hyperlipidemia   . Diabetes   . Asthma with COPD   . CAD (coronary artery disease)   . Skin cancer   . Hyperlipidemia LDL goal < 70 05/10/2012  . Essential hypertension 05/10/2012  . BPH (benign prostatic hyperplasia) 05/10/2012  . Coronary artery disease 05/10/2012  . COPD (chronic obstructive pulmonary disease) 05/10/2012  . AAA (abdominal aortic aneurysm) 05/10/2012  . Type 2 diabetes mellitus 05/10/2012  . Alzheimer disease   . Macular degeneration     Past Surgical History  Procedure Laterality Date  . Eye transplant    . Remove gallstones    . Abdominal aortic aneurysm repair    . Removed gall bladder     Family History  Problem  Relation Age of Onset  . Hypertension Father     History   Social History  . Marital Status: Married    Spouse Name: N/A  . Number of Children: N/A  . Years of Education: N/A   Occupational History  . Not on file.   Social History Main Topics  . Smoking status: Former Smoker -- 2.50 packs/day for 60 years    Types: Cigarettes    Quit date: 07/07/1989  . Smokeless tobacco: Never Used     Comment: quit 25 years ago  . Alcohol Use: No  . Drug Use: No  . Sexual Activity: Not on file   Other Topics Concern  . Not on file   Social History Narrative     Objective: BP 101/58 mmHg  Pulse 113  Wt 126 lb (57.153 kg)  General: Alert and Oriented, No Acute Distress HEENT: Pupils equal, round, reactive to light. Conjunctivae clear.  Moist mucousmembranes pharynx unremarkable. Lungs: comfortable work of breathing with trace end expiratory wheezing in all lung fields. No rhonchi or rales. Cardiac: Regular rate and rhythm. Normal S1/S2.  No murmurs, rubs, nor gallops.   Extremities: No peripheral edema.  Strong peripheral pulses.  Mental Status: No depression, anxiety, nor agitation. Skin: Warm and dry.  Assessment & Plan: Mark Cooley was seen today for diabetes.  Diagnoses and all orders for this visit:  Type 2 diabetes mellitus with diabetic chronic kidney disease  Essential hypertension  Hematuria Orders: -     Urine  culture -     Urinalysis, Routine w reflex microscopic (not at [facility])  Irritability  Other orders -     PARoxetine (PAXIL) 30 MG tablet; Take 1 tablet (30 mg total) by mouth daily.   Type 2 diabetes:A1c 7.4 today, controlled continue metformin/glipizide for goal A1c less than 8 Essentialhypertension: Controlled with sodium restriction Hematuria: Repeat urinalysis and culture Irritability:uncontrolled Increasing Paxil, counseled the patient on not going out between the hours of 11-4 to minimize his risk of heat exhaustion or breathing issues.  Also due to  his SOB with minimal exertion he needs to ensure he's using his portable oxygen when outside.  His ability to ambulate outside the house is severely impaired and he would benefit from a wheelchair.     Return in about 3 months (around 05/09/2015) for blood sugar.

## 2015-02-08 DIAGNOSIS — J449 Chronic obstructive pulmonary disease, unspecified: Secondary | ICD-10-CM | POA: Diagnosis not present

## 2015-02-08 DIAGNOSIS — Z5189 Encounter for other specified aftercare: Secondary | ICD-10-CM | POA: Diagnosis not present

## 2015-02-08 DIAGNOSIS — F0280 Dementia in other diseases classified elsewhere without behavioral disturbance: Secondary | ICD-10-CM | POA: Diagnosis not present

## 2015-02-08 DIAGNOSIS — G309 Alzheimer's disease, unspecified: Secondary | ICD-10-CM | POA: Diagnosis not present

## 2015-02-08 DIAGNOSIS — I10 Essential (primary) hypertension: Secondary | ICD-10-CM | POA: Diagnosis not present

## 2015-02-08 DIAGNOSIS — E119 Type 2 diabetes mellitus without complications: Secondary | ICD-10-CM | POA: Diagnosis not present

## 2015-02-08 DIAGNOSIS — I2510 Atherosclerotic heart disease of native coronary artery without angina pectoris: Secondary | ICD-10-CM | POA: Diagnosis not present

## 2015-02-09 DIAGNOSIS — R319 Hematuria, unspecified: Secondary | ICD-10-CM | POA: Diagnosis not present

## 2015-02-10 LAB — URINALYSIS, ROUTINE W REFLEX MICROSCOPIC
Bilirubin Urine: NEGATIVE
HGB URINE DIPSTICK: NEGATIVE
KETONES UR: NEGATIVE
LEUKOCYTES UA: NEGATIVE
Nitrite: NEGATIVE
PH: 5 (ref 5.0–8.0)
SPECIFIC GRAVITY, URINE: 1.019 (ref 1.001–1.035)

## 2015-02-10 LAB — URINE CULTURE: Colony Count: 30000

## 2015-02-13 DIAGNOSIS — G309 Alzheimer's disease, unspecified: Secondary | ICD-10-CM | POA: Diagnosis not present

## 2015-02-13 DIAGNOSIS — R609 Edema, unspecified: Secondary | ICD-10-CM | POA: Diagnosis not present

## 2015-02-13 DIAGNOSIS — I2510 Atherosclerotic heart disease of native coronary artery without angina pectoris: Secondary | ICD-10-CM | POA: Diagnosis not present

## 2015-02-13 DIAGNOSIS — Z5189 Encounter for other specified aftercare: Secondary | ICD-10-CM | POA: Diagnosis not present

## 2015-02-13 DIAGNOSIS — I10 Essential (primary) hypertension: Secondary | ICD-10-CM | POA: Diagnosis not present

## 2015-02-13 DIAGNOSIS — J449 Chronic obstructive pulmonary disease, unspecified: Secondary | ICD-10-CM | POA: Diagnosis not present

## 2015-02-13 DIAGNOSIS — F0280 Dementia in other diseases classified elsewhere without behavioral disturbance: Secondary | ICD-10-CM | POA: Diagnosis not present

## 2015-02-13 DIAGNOSIS — E119 Type 2 diabetes mellitus without complications: Secondary | ICD-10-CM | POA: Diagnosis not present

## 2015-02-16 DIAGNOSIS — I2510 Atherosclerotic heart disease of native coronary artery without angina pectoris: Secondary | ICD-10-CM | POA: Diagnosis not present

## 2015-02-16 DIAGNOSIS — I10 Essential (primary) hypertension: Secondary | ICD-10-CM | POA: Diagnosis not present

## 2015-02-16 DIAGNOSIS — E119 Type 2 diabetes mellitus without complications: Secondary | ICD-10-CM | POA: Diagnosis not present

## 2015-02-16 DIAGNOSIS — F0280 Dementia in other diseases classified elsewhere without behavioral disturbance: Secondary | ICD-10-CM | POA: Diagnosis not present

## 2015-02-16 DIAGNOSIS — G309 Alzheimer's disease, unspecified: Secondary | ICD-10-CM | POA: Diagnosis not present

## 2015-02-16 DIAGNOSIS — Z5189 Encounter for other specified aftercare: Secondary | ICD-10-CM | POA: Diagnosis not present

## 2015-02-16 DIAGNOSIS — J449 Chronic obstructive pulmonary disease, unspecified: Secondary | ICD-10-CM | POA: Diagnosis not present

## 2015-02-20 DIAGNOSIS — F0280 Dementia in other diseases classified elsewhere without behavioral disturbance: Secondary | ICD-10-CM | POA: Diagnosis not present

## 2015-02-20 DIAGNOSIS — I10 Essential (primary) hypertension: Secondary | ICD-10-CM | POA: Diagnosis not present

## 2015-02-20 DIAGNOSIS — E119 Type 2 diabetes mellitus without complications: Secondary | ICD-10-CM | POA: Diagnosis not present

## 2015-02-20 DIAGNOSIS — G309 Alzheimer's disease, unspecified: Secondary | ICD-10-CM | POA: Diagnosis not present

## 2015-02-20 DIAGNOSIS — Z5189 Encounter for other specified aftercare: Secondary | ICD-10-CM | POA: Diagnosis not present

## 2015-02-20 DIAGNOSIS — I2510 Atherosclerotic heart disease of native coronary artery without angina pectoris: Secondary | ICD-10-CM | POA: Diagnosis not present

## 2015-02-20 DIAGNOSIS — J449 Chronic obstructive pulmonary disease, unspecified: Secondary | ICD-10-CM | POA: Diagnosis not present

## 2015-02-21 DIAGNOSIS — I10 Essential (primary) hypertension: Secondary | ICD-10-CM | POA: Diagnosis not present

## 2015-02-21 DIAGNOSIS — I2510 Atherosclerotic heart disease of native coronary artery without angina pectoris: Secondary | ICD-10-CM | POA: Diagnosis not present

## 2015-02-22 ENCOUNTER — Encounter: Payer: Self-pay | Admitting: Family Medicine

## 2015-02-22 DIAGNOSIS — L602 Onychogryphosis: Secondary | ICD-10-CM | POA: Diagnosis not present

## 2015-02-22 DIAGNOSIS — L84 Corns and callosities: Secondary | ICD-10-CM | POA: Diagnosis not present

## 2015-02-22 DIAGNOSIS — I272 Pulmonary hypertension, unspecified: Secondary | ICD-10-CM | POA: Insufficient documentation

## 2015-02-22 DIAGNOSIS — E119 Type 2 diabetes mellitus without complications: Secondary | ICD-10-CM | POA: Diagnosis not present

## 2015-02-23 ENCOUNTER — Telehealth: Payer: Self-pay | Admitting: *Deleted

## 2015-02-23 DIAGNOSIS — Z5189 Encounter for other specified aftercare: Secondary | ICD-10-CM | POA: Diagnosis not present

## 2015-02-23 DIAGNOSIS — I10 Essential (primary) hypertension: Secondary | ICD-10-CM | POA: Diagnosis not present

## 2015-02-23 DIAGNOSIS — E119 Type 2 diabetes mellitus without complications: Secondary | ICD-10-CM | POA: Diagnosis not present

## 2015-02-23 DIAGNOSIS — I2510 Atherosclerotic heart disease of native coronary artery without angina pectoris: Secondary | ICD-10-CM | POA: Diagnosis not present

## 2015-02-23 DIAGNOSIS — G309 Alzheimer's disease, unspecified: Secondary | ICD-10-CM | POA: Diagnosis not present

## 2015-02-23 DIAGNOSIS — F0280 Dementia in other diseases classified elsewhere without behavioral disturbance: Secondary | ICD-10-CM | POA: Diagnosis not present

## 2015-02-23 DIAGNOSIS — J449 Chronic obstructive pulmonary disease, unspecified: Secondary | ICD-10-CM | POA: Diagnosis not present

## 2015-02-23 NOTE — Telephone Encounter (Signed)
[employer] needs a provider determination in order to get a wheelchair approved. Call provider line at [phone]

## 2015-02-24 ENCOUNTER — Other Ambulatory Visit: Payer: Self-pay | Admitting: Family Medicine

## 2015-02-26 ENCOUNTER — Other Ambulatory Visit: Payer: Self-pay | Admitting: Family Medicine

## 2015-02-26 NOTE — Telephone Encounter (Signed)
On hold for 30 minutes, will attempt again later.

## 2015-02-27 DIAGNOSIS — I10 Essential (primary) hypertension: Secondary | ICD-10-CM | POA: Diagnosis not present

## 2015-02-27 DIAGNOSIS — E119 Type 2 diabetes mellitus without complications: Secondary | ICD-10-CM | POA: Diagnosis not present

## 2015-02-27 DIAGNOSIS — I2510 Atherosclerotic heart disease of native coronary artery without angina pectoris: Secondary | ICD-10-CM | POA: Diagnosis not present

## 2015-02-27 DIAGNOSIS — J438 Other emphysema: Secondary | ICD-10-CM | POA: Diagnosis not present

## 2015-02-27 DIAGNOSIS — F0280 Dementia in other diseases classified elsewhere without behavioral disturbance: Secondary | ICD-10-CM | POA: Diagnosis not present

## 2015-02-27 DIAGNOSIS — J449 Chronic obstructive pulmonary disease, unspecified: Secondary | ICD-10-CM | POA: Diagnosis not present

## 2015-02-27 DIAGNOSIS — Z5189 Encounter for other specified aftercare: Secondary | ICD-10-CM | POA: Diagnosis not present

## 2015-02-27 DIAGNOSIS — G309 Alzheimer's disease, unspecified: Secondary | ICD-10-CM | POA: Diagnosis not present

## 2015-03-01 ENCOUNTER — Other Ambulatory Visit: Payer: Self-pay | Admitting: *Deleted

## 2015-03-01 ENCOUNTER — Encounter: Payer: Self-pay | Admitting: Family Medicine

## 2015-03-01 DIAGNOSIS — Z5189 Encounter for other specified aftercare: Secondary | ICD-10-CM | POA: Diagnosis not present

## 2015-03-01 DIAGNOSIS — G309 Alzheimer's disease, unspecified: Secondary | ICD-10-CM | POA: Diagnosis not present

## 2015-03-01 DIAGNOSIS — F0280 Dementia in other diseases classified elsewhere without behavioral disturbance: Secondary | ICD-10-CM | POA: Diagnosis not present

## 2015-03-01 DIAGNOSIS — G4700 Insomnia, unspecified: Secondary | ICD-10-CM

## 2015-03-01 DIAGNOSIS — J449 Chronic obstructive pulmonary disease, unspecified: Secondary | ICD-10-CM | POA: Diagnosis not present

## 2015-03-01 DIAGNOSIS — I2510 Atherosclerotic heart disease of native coronary artery without angina pectoris: Secondary | ICD-10-CM | POA: Diagnosis not present

## 2015-03-01 DIAGNOSIS — E119 Type 2 diabetes mellitus without complications: Secondary | ICD-10-CM | POA: Diagnosis not present

## 2015-03-01 DIAGNOSIS — I10 Essential (primary) hypertension: Secondary | ICD-10-CM | POA: Diagnosis not present

## 2015-03-01 MED ORDER — TRAZODONE HCL 50 MG PO TABS: 75.0000 mg | ORAL_TABLET | Freq: Every evening | ORAL | Status: DC | PRN

## 2015-03-05 ENCOUNTER — Other Ambulatory Visit: Payer: Self-pay | Admitting: Family Medicine

## 2015-03-05 MED ORDER — GLIPIZIDE-METFORMIN HCL 5-500 MG PO TABS: 2.0000 | ORAL_TABLET | Freq: Two times a day (BID) | ORAL | Status: DC

## 2015-03-07 DIAGNOSIS — Z5189 Encounter for other specified aftercare: Secondary | ICD-10-CM | POA: Diagnosis not present

## 2015-03-07 DIAGNOSIS — J449 Chronic obstructive pulmonary disease, unspecified: Secondary | ICD-10-CM | POA: Diagnosis not present

## 2015-03-07 DIAGNOSIS — G309 Alzheimer's disease, unspecified: Secondary | ICD-10-CM | POA: Diagnosis not present

## 2015-03-07 DIAGNOSIS — I2510 Atherosclerotic heart disease of native coronary artery without angina pectoris: Secondary | ICD-10-CM | POA: Diagnosis not present

## 2015-03-07 DIAGNOSIS — I10 Essential (primary) hypertension: Secondary | ICD-10-CM | POA: Diagnosis not present

## 2015-03-07 DIAGNOSIS — F0280 Dementia in other diseases classified elsewhere without behavioral disturbance: Secondary | ICD-10-CM | POA: Diagnosis not present

## 2015-03-07 DIAGNOSIS — E119 Type 2 diabetes mellitus without complications: Secondary | ICD-10-CM | POA: Diagnosis not present

## 2015-03-14 DIAGNOSIS — Z5189 Encounter for other specified aftercare: Secondary | ICD-10-CM | POA: Diagnosis not present

## 2015-03-14 DIAGNOSIS — J449 Chronic obstructive pulmonary disease, unspecified: Secondary | ICD-10-CM | POA: Diagnosis not present

## 2015-03-14 DIAGNOSIS — I10 Essential (primary) hypertension: Secondary | ICD-10-CM | POA: Diagnosis not present

## 2015-03-14 DIAGNOSIS — E119 Type 2 diabetes mellitus without complications: Secondary | ICD-10-CM | POA: Diagnosis not present

## 2015-03-14 DIAGNOSIS — I2510 Atherosclerotic heart disease of native coronary artery without angina pectoris: Secondary | ICD-10-CM | POA: Diagnosis not present

## 2015-03-14 DIAGNOSIS — G309 Alzheimer's disease, unspecified: Secondary | ICD-10-CM | POA: Diagnosis not present

## 2015-03-14 DIAGNOSIS — F0280 Dementia in other diseases classified elsewhere without behavioral disturbance: Secondary | ICD-10-CM | POA: Diagnosis not present

## 2015-03-15 DIAGNOSIS — J449 Chronic obstructive pulmonary disease, unspecified: Secondary | ICD-10-CM | POA: Diagnosis not present

## 2015-03-15 DIAGNOSIS — G309 Alzheimer's disease, unspecified: Secondary | ICD-10-CM | POA: Diagnosis not present

## 2015-03-15 DIAGNOSIS — I2510 Atherosclerotic heart disease of native coronary artery without angina pectoris: Secondary | ICD-10-CM | POA: Diagnosis not present

## 2015-03-15 DIAGNOSIS — F0280 Dementia in other diseases classified elsewhere without behavioral disturbance: Secondary | ICD-10-CM | POA: Diagnosis not present

## 2015-03-15 DIAGNOSIS — Z5189 Encounter for other specified aftercare: Secondary | ICD-10-CM | POA: Diagnosis not present

## 2015-03-15 DIAGNOSIS — I10 Essential (primary) hypertension: Secondary | ICD-10-CM | POA: Diagnosis not present

## 2015-03-15 DIAGNOSIS — E119 Type 2 diabetes mellitus without complications: Secondary | ICD-10-CM | POA: Diagnosis not present

## 2015-03-16 ENCOUNTER — Telehealth: Payer: Self-pay

## 2015-03-16 NOTE — Telephone Encounter (Signed)
[facility] called and left a message asking about paperwork for a transport chair. Has paperwork been sign and faxed back to [place]? Call [place] with any questions.

## 2015-03-16 NOTE — Telephone Encounter (Signed)
Paper work has been faxed

## 2015-03-19 ENCOUNTER — Telehealth: Payer: Self-pay | Admitting: *Deleted

## 2015-03-19 NOTE — Telephone Encounter (Signed)
Beth from [facility] called and states the last office note needs to have something stating the patient can't ambulate outside of the home. He is trying to get a wheelchair to use when he has to go out of the house and in particular to the doctors office. Beth asked if the last progress note could be addended to say this

## 2015-03-19 NOTE — Telephone Encounter (Signed)
Yes I think that would still qualify him to get the wheelchair

## 2015-03-19 NOTE — Telephone Encounter (Signed)
Note addended and in your in box.

## 2015-03-19 NOTE — Telephone Encounter (Signed)
I've seen him ambulating here in the clinic so I can't truthfully say that he can't walk outside of the house.  What if I say he has difficulty walking outside of the house? "Can't" is just too strong of a statement.

## 2015-03-20 NOTE — Telephone Encounter (Signed)
addended note faxed to [facility]

## 2015-03-30 DIAGNOSIS — J438 Other emphysema: Secondary | ICD-10-CM | POA: Diagnosis not present

## 2015-04-22 DIAGNOSIS — G309 Alzheimer's disease, unspecified: Secondary | ICD-10-CM | POA: Diagnosis not present

## 2015-04-22 DIAGNOSIS — H3530 Unspecified macular degeneration: Secondary | ICD-10-CM | POA: Diagnosis not present

## 2015-04-22 DIAGNOSIS — I10 Essential (primary) hypertension: Secondary | ICD-10-CM | POA: Diagnosis not present

## 2015-04-22 DIAGNOSIS — Z9049 Acquired absence of other specified parts of digestive tract: Secondary | ICD-10-CM | POA: Diagnosis not present

## 2015-04-22 DIAGNOSIS — D509 Iron deficiency anemia, unspecified: Secondary | ICD-10-CM | POA: Diagnosis not present

## 2015-04-22 DIAGNOSIS — E785 Hyperlipidemia, unspecified: Secondary | ICD-10-CM | POA: Diagnosis not present

## 2015-04-22 DIAGNOSIS — I2510 Atherosclerotic heart disease of native coronary artery without angina pectoris: Secondary | ICD-10-CM | POA: Diagnosis not present

## 2015-04-22 DIAGNOSIS — E119 Type 2 diabetes mellitus without complications: Secondary | ICD-10-CM | POA: Diagnosis not present

## 2015-04-22 DIAGNOSIS — R4182 Altered mental status, unspecified: Secondary | ICD-10-CM | POA: Diagnosis not present

## 2015-04-22 DIAGNOSIS — N179 Acute kidney failure, unspecified: Secondary | ICD-10-CM | POA: Diagnosis not present

## 2015-04-22 DIAGNOSIS — R55 Syncope and collapse: Secondary | ICD-10-CM | POA: Diagnosis not present

## 2015-04-22 DIAGNOSIS — Z888 Allergy status to other drugs, medicaments and biological substances status: Secondary | ICD-10-CM | POA: Diagnosis not present

## 2015-04-22 DIAGNOSIS — I639 Cerebral infarction, unspecified: Secondary | ICD-10-CM | POA: Diagnosis not present

## 2015-04-22 DIAGNOSIS — K5900 Constipation, unspecified: Secondary | ICD-10-CM | POA: Diagnosis not present

## 2015-04-22 DIAGNOSIS — Z79899 Other long term (current) drug therapy: Secondary | ICD-10-CM | POA: Diagnosis not present

## 2015-04-22 DIAGNOSIS — Z7982 Long term (current) use of aspirin: Secondary | ICD-10-CM | POA: Diagnosis not present

## 2015-04-22 DIAGNOSIS — A419 Sepsis, unspecified organism: Secondary | ICD-10-CM | POA: Diagnosis not present

## 2015-04-22 DIAGNOSIS — R509 Fever, unspecified: Secondary | ICD-10-CM | POA: Diagnosis not present

## 2015-04-22 DIAGNOSIS — E860 Dehydration: Secondary | ICD-10-CM | POA: Diagnosis not present

## 2015-04-22 DIAGNOSIS — Z87891 Personal history of nicotine dependence: Secondary | ICD-10-CM | POA: Diagnosis not present

## 2015-04-22 DIAGNOSIS — E440 Moderate protein-calorie malnutrition: Secondary | ICD-10-CM | POA: Diagnosis not present

## 2015-04-22 DIAGNOSIS — F0280 Dementia in other diseases classified elsewhere without behavioral disturbance: Secondary | ICD-10-CM | POA: Diagnosis not present

## 2015-04-22 DIAGNOSIS — R402421 Glasgow coma scale score 9-12, in the field [EMT or ambulance]: Secondary | ICD-10-CM | POA: Diagnosis not present

## 2015-04-22 DIAGNOSIS — I700 Atherosclerosis of aorta: Secondary | ICD-10-CM | POA: Diagnosis not present

## 2015-04-22 DIAGNOSIS — Z9981 Dependence on supplemental oxygen: Secondary | ICD-10-CM | POA: Diagnosis not present

## 2015-04-22 DIAGNOSIS — J45909 Unspecified asthma, uncomplicated: Secondary | ICD-10-CM | POA: Diagnosis not present

## 2015-04-22 DIAGNOSIS — R531 Weakness: Secondary | ICD-10-CM | POA: Diagnosis not present

## 2015-04-22 DIAGNOSIS — R9082 White matter disease, unspecified: Secondary | ICD-10-CM | POA: Diagnosis not present

## 2015-04-22 DIAGNOSIS — J449 Chronic obstructive pulmonary disease, unspecified: Secondary | ICD-10-CM | POA: Diagnosis not present

## 2015-04-22 DIAGNOSIS — E1129 Type 2 diabetes mellitus with other diabetic kidney complication: Secondary | ICD-10-CM | POA: Diagnosis not present

## 2015-04-22 DIAGNOSIS — Z681 Body mass index (BMI) 19 or less, adult: Secondary | ICD-10-CM | POA: Diagnosis not present

## 2015-04-29 DIAGNOSIS — J438 Other emphysema: Secondary | ICD-10-CM | POA: Diagnosis not present

## 2015-04-29 DIAGNOSIS — I2510 Atherosclerotic heart disease of native coronary artery without angina pectoris: Secondary | ICD-10-CM | POA: Diagnosis not present

## 2015-04-29 DIAGNOSIS — E119 Type 2 diabetes mellitus without complications: Secondary | ICD-10-CM | POA: Diagnosis not present

## 2015-04-29 DIAGNOSIS — I10 Essential (primary) hypertension: Secondary | ICD-10-CM | POA: Diagnosis not present

## 2015-04-29 DIAGNOSIS — J449 Chronic obstructive pulmonary disease, unspecified: Secondary | ICD-10-CM | POA: Diagnosis not present

## 2015-04-30 ENCOUNTER — Telehealth: Payer: Self-pay

## 2015-04-30 NOTE — Telephone Encounter (Signed)
Melissa from [facility] called to get an verbal order for Mark Cooley to get therapy twice a week for 3 weeks.

## 2015-04-30 NOTE — Telephone Encounter (Signed)
I approve this, can you please give Mark Cooley a verbal order for this.

## 2015-04-30 NOTE — Telephone Encounter (Signed)
Melissa advised

## 2015-05-01 DIAGNOSIS — I2510 Atherosclerotic heart disease of native coronary artery without angina pectoris: Secondary | ICD-10-CM | POA: Diagnosis not present

## 2015-05-01 DIAGNOSIS — E119 Type 2 diabetes mellitus without complications: Secondary | ICD-10-CM | POA: Diagnosis not present

## 2015-05-01 DIAGNOSIS — I10 Essential (primary) hypertension: Secondary | ICD-10-CM | POA: Diagnosis not present

## 2015-05-01 DIAGNOSIS — J449 Chronic obstructive pulmonary disease, unspecified: Secondary | ICD-10-CM | POA: Diagnosis not present

## 2015-05-02 DIAGNOSIS — I2510 Atherosclerotic heart disease of native coronary artery without angina pectoris: Secondary | ICD-10-CM | POA: Diagnosis not present

## 2015-05-02 DIAGNOSIS — E119 Type 2 diabetes mellitus without complications: Secondary | ICD-10-CM | POA: Diagnosis not present

## 2015-05-02 DIAGNOSIS — J449 Chronic obstructive pulmonary disease, unspecified: Secondary | ICD-10-CM | POA: Diagnosis not present

## 2015-05-02 DIAGNOSIS — I10 Essential (primary) hypertension: Secondary | ICD-10-CM | POA: Diagnosis not present

## 2015-05-03 ENCOUNTER — Encounter: Payer: Self-pay | Admitting: Family Medicine

## 2015-05-03 ENCOUNTER — Ambulatory Visit (INDEPENDENT_AMBULATORY_CARE_PROVIDER_SITE_OTHER): Payer: Medicare Other | Admitting: Family Medicine

## 2015-05-03 VITALS — BP 126/70 | HR 87

## 2015-05-03 DIAGNOSIS — N183 Chronic kidney disease, stage 3 unspecified: Secondary | ICD-10-CM

## 2015-05-03 DIAGNOSIS — D509 Iron deficiency anemia, unspecified: Secondary | ICD-10-CM | POA: Diagnosis not present

## 2015-05-03 DIAGNOSIS — F05 Delirium due to known physiological condition: Secondary | ICD-10-CM | POA: Diagnosis not present

## 2015-05-03 DIAGNOSIS — E1122 Type 2 diabetes mellitus with diabetic chronic kidney disease: Secondary | ICD-10-CM

## 2015-05-03 DIAGNOSIS — R410 Disorientation, unspecified: Secondary | ICD-10-CM

## 2015-05-03 LAB — POCT URINALYSIS DIPSTICK
Bilirubin, UA: NEGATIVE
Glucose, UA: 500
Ketones, UA: NEGATIVE
LEUKOCYTES UA: NEGATIVE
NITRITE UA: NEGATIVE
Spec Grav, UA: 1.02
UROBILINOGEN UA: 0.2
pH, UA: 6.5

## 2015-05-03 MED ORDER — FERROUS SULFATE 325 (65 FE) MG PO TBEC: 325.0000 mg | DELAYED_RELEASE_TABLET | Freq: Every day | ORAL | Status: DC

## 2015-05-03 NOTE — Progress Notes (Signed)
CC: Mark Cooley is a 81 y.o. male is here for Hospitalization Follow-up   Subjective: HPI:  Hospital follow-up for altered mental status with no clear etiology of what was causing this other than anemia and poor nutrition. Since returning home he reports being weak, family notes that he is not walking well even with his walker. He prefers to be in the wheelchair which is abnormal for him. He was discharged after getting a unit of blood and started on iron supple mentation which she is compliant with. He is getting physical therapy and they only have had time to come out once. Family's major concern is a significant degree of confusion since returning home but a large improvement compared to when he was admitted. Patient  Seems to have significant short-term memory loss. There have been no falls, wandering or abusive behaviors. He was discharged with a hemoglobin level of 9.  Patient is now having to wear diapers which is abnormal and has been caught urinating on the carpet twice. He denies dysuria, urinary urgency, hesitancy or any other genitourinary complaints.patient has no complaints today.   Review Of Systems Outlined In HPI  Past Medical History  Diagnosis Date  . Heart disease   . Hypertension   . Hyperlipidemia   . Diabetes ([place])   . Asthma with COPD ([place])   . CAD (coronary artery disease)   . Skin cancer   . Hyperlipidemia LDL goal < 70 05/10/2012  . Essential hypertension 05/10/2012  . BPH (benign prostatic hyperplasia) 05/10/2012  . Coronary artery disease 05/10/2012  . COPD (chronic obstructive pulmonary disease) ([place]) 05/10/2012  . AAA (abdominal aortic aneurysm) ([place]) 05/10/2012  . Type 2 diabetes mellitus ([place]) 05/10/2012  . Alzheimer disease   . Macular degeneration     Past Surgical History  Procedure Laterality Date  . Eye transplant    . Remove gallstones    . Abdominal aortic aneurysm repair    . Removed gall bladder     Family History  Problem Relation Age of  Onset  . Hypertension Father     Social History   Social History  . Marital Status: Married    Spouse Name: N/A  . Number of Children: N/A  . Years of Education: N/A   Occupational History  . Not on file.   Social History Main Topics  . Smoking status: Former Smoker -- 2.50 packs/day for 60 years    Types: Cigarettes    Quit date: 07/07/1989  . Smokeless tobacco: Never Used     Comment: quit 25 years ago  . Alcohol Use: No  . Drug Use: No  . Sexual Activity: Not on file   Other Topics Concern  . Not on file   Social History Narrative     Objective: BP 126/70 mmHg  Pulse 87  General: Alert and Oriented to person and place, No Acute Distress HEENT: Pupils equal, round, reactive to light. Conjunctivae clear.  External ears unremarkable, canals clear with intact TMs with appropriate landmarks.  Middle ear appears open without effusion. Pink inferior turbinates.  Moist mucous membranes, pharynx without inflammation nor lesions.  Neck supple without palpable lymphadenopathy nor abnormal masses. Lungs: Clear to auscultation bilaterally, no wheezing/ronchi/rales.  Comfortable work of breathing. Good air movement. Cardiac: Regular rate and rhythm. Normal S1/S2.  No murmurs, rubs, nor gallops.   Extremities: No peripheral edema.  Strong peripheral pulses.  Mental Status: No depression, anxiety, nor agitation. Skin: Warm and dry.  Assessment & Plan: Mark Cooley  was seen today for hospitalization follow-up.  Diagnoses and all orders for this visit:  Anemia, iron deficiency -     CBC -     ferrous sulfate 325 (65 FE) MG EC tablet; Take 1 tablet (325 mg total) by mouth daily with breakfast. -     POCT urinalysis dipstick -     Urine Culture  Type 2 diabetes mellitus with stage 3 chronic kidney disease, without long-term current use of insulin (HCC)  Subacute confusional state   Iron deficiency anemia: Continue daily iron sulfate, checking hemoglobin level today. Family tells  me that a Hemoccult stool card was negative in the hospital. Type 2 diabetes: Will confirm the family has restarted his glipizide and metformin was held during the hospitalization Confusion: Urinalysis was obtained by Elenore Rota see any record of a urine culture, this will be obtained today to rule out UTI causing lingering confusion. I was taken to prepare the family that it may take up to 5 weeks since he was hospitalized for 5 days for him to get back to his physical baseline. Agree with physical therapy   Return in about 4 weeks (around 05/31/2015).

## 2015-05-04 DIAGNOSIS — J449 Chronic obstructive pulmonary disease, unspecified: Secondary | ICD-10-CM | POA: Diagnosis not present

## 2015-05-04 DIAGNOSIS — E119 Type 2 diabetes mellitus without complications: Secondary | ICD-10-CM | POA: Diagnosis not present

## 2015-05-04 DIAGNOSIS — I2510 Atherosclerotic heart disease of native coronary artery without angina pectoris: Secondary | ICD-10-CM | POA: Diagnosis not present

## 2015-05-04 DIAGNOSIS — I10 Essential (primary) hypertension: Secondary | ICD-10-CM | POA: Diagnosis not present

## 2015-05-04 LAB — CBC
HCT: 33.9 % — ABNORMAL LOW (ref 39.0–52.0)
Hemoglobin: 10.1 g/dL — ABNORMAL LOW (ref 13.0–17.0)
MCH: 24.3 pg — ABNORMAL LOW (ref 26.0–34.0)
MCHC: 29.8 g/dL — AB (ref 30.0–36.0)
MCV: 81.5 fL (ref 78.0–100.0)
MPV: 10.3 fL (ref 8.6–12.4)
PLATELETS: 143 10*3/uL — AB (ref 150–400)
RBC: 4.16 MIL/uL — ABNORMAL LOW (ref 4.22–5.81)
RDW: 20.2 % — AB (ref 11.5–15.5)
WBC: 5.1 10*3/uL (ref 4.0–10.5)

## 2015-05-05 LAB — URINE CULTURE
Colony Count: NO GROWTH
Organism ID, Bacteria: NO GROWTH

## 2015-05-07 DIAGNOSIS — J449 Chronic obstructive pulmonary disease, unspecified: Secondary | ICD-10-CM | POA: Diagnosis not present

## 2015-05-07 DIAGNOSIS — I2510 Atherosclerotic heart disease of native coronary artery without angina pectoris: Secondary | ICD-10-CM | POA: Diagnosis not present

## 2015-05-07 DIAGNOSIS — I10 Essential (primary) hypertension: Secondary | ICD-10-CM | POA: Diagnosis not present

## 2015-05-07 DIAGNOSIS — E119 Type 2 diabetes mellitus without complications: Secondary | ICD-10-CM | POA: Diagnosis not present

## 2015-05-09 ENCOUNTER — Ambulatory Visit: Payer: Medicare Other | Admitting: Family Medicine

## 2015-05-09 DIAGNOSIS — J449 Chronic obstructive pulmonary disease, unspecified: Secondary | ICD-10-CM | POA: Diagnosis not present

## 2015-05-09 DIAGNOSIS — I2510 Atherosclerotic heart disease of native coronary artery without angina pectoris: Secondary | ICD-10-CM | POA: Diagnosis not present

## 2015-05-09 DIAGNOSIS — E119 Type 2 diabetes mellitus without complications: Secondary | ICD-10-CM | POA: Diagnosis not present

## 2015-05-09 DIAGNOSIS — I10 Essential (primary) hypertension: Secondary | ICD-10-CM | POA: Diagnosis not present

## 2015-05-10 DIAGNOSIS — I10 Essential (primary) hypertension: Secondary | ICD-10-CM | POA: Diagnosis not present

## 2015-05-10 DIAGNOSIS — I2510 Atherosclerotic heart disease of native coronary artery without angina pectoris: Secondary | ICD-10-CM | POA: Diagnosis not present

## 2015-05-10 DIAGNOSIS — J449 Chronic obstructive pulmonary disease, unspecified: Secondary | ICD-10-CM | POA: Diagnosis not present

## 2015-05-10 DIAGNOSIS — E119 Type 2 diabetes mellitus without complications: Secondary | ICD-10-CM | POA: Diagnosis not present

## 2015-05-13 DIAGNOSIS — J449 Chronic obstructive pulmonary disease, unspecified: Secondary | ICD-10-CM | POA: Diagnosis not present

## 2015-05-13 DIAGNOSIS — I2510 Atherosclerotic heart disease of native coronary artery without angina pectoris: Secondary | ICD-10-CM | POA: Diagnosis not present

## 2015-05-13 DIAGNOSIS — I10 Essential (primary) hypertension: Secondary | ICD-10-CM | POA: Diagnosis not present

## 2015-05-13 DIAGNOSIS — E119 Type 2 diabetes mellitus without complications: Secondary | ICD-10-CM | POA: Diagnosis not present

## 2015-05-14 DIAGNOSIS — I2510 Atherosclerotic heart disease of native coronary artery without angina pectoris: Secondary | ICD-10-CM | POA: Diagnosis not present

## 2015-05-14 DIAGNOSIS — I10 Essential (primary) hypertension: Secondary | ICD-10-CM | POA: Diagnosis not present

## 2015-05-14 DIAGNOSIS — E119 Type 2 diabetes mellitus without complications: Secondary | ICD-10-CM | POA: Diagnosis not present

## 2015-05-14 DIAGNOSIS — J449 Chronic obstructive pulmonary disease, unspecified: Secondary | ICD-10-CM | POA: Diagnosis not present

## 2015-05-15 ENCOUNTER — Other Ambulatory Visit: Payer: Self-pay | Admitting: Family Medicine

## 2015-05-15 DIAGNOSIS — E119 Type 2 diabetes mellitus without complications: Secondary | ICD-10-CM | POA: Diagnosis not present

## 2015-05-15 DIAGNOSIS — I2510 Atherosclerotic heart disease of native coronary artery without angina pectoris: Secondary | ICD-10-CM | POA: Diagnosis not present

## 2015-05-15 DIAGNOSIS — J449 Chronic obstructive pulmonary disease, unspecified: Secondary | ICD-10-CM | POA: Diagnosis not present

## 2015-05-15 DIAGNOSIS — I10 Essential (primary) hypertension: Secondary | ICD-10-CM | POA: Diagnosis not present

## 2015-05-16 DIAGNOSIS — E119 Type 2 diabetes mellitus without complications: Secondary | ICD-10-CM | POA: Diagnosis not present

## 2015-05-16 DIAGNOSIS — J449 Chronic obstructive pulmonary disease, unspecified: Secondary | ICD-10-CM | POA: Diagnosis not present

## 2015-05-16 DIAGNOSIS — I2510 Atherosclerotic heart disease of native coronary artery without angina pectoris: Secondary | ICD-10-CM | POA: Diagnosis not present

## 2015-05-16 DIAGNOSIS — I10 Essential (primary) hypertension: Secondary | ICD-10-CM | POA: Diagnosis not present

## 2015-05-17 NOTE — Telephone Encounter (Signed)
No lipid since 11/09/13. Please advise

## 2015-05-21 DIAGNOSIS — R413 Other amnesia: Secondary | ICD-10-CM | POA: Diagnosis not present

## 2015-05-21 DIAGNOSIS — N390 Urinary tract infection, site not specified: Secondary | ICD-10-CM | POA: Diagnosis not present

## 2015-05-23 DIAGNOSIS — J449 Chronic obstructive pulmonary disease, unspecified: Secondary | ICD-10-CM | POA: Diagnosis not present

## 2015-05-23 DIAGNOSIS — E119 Type 2 diabetes mellitus without complications: Secondary | ICD-10-CM | POA: Diagnosis not present

## 2015-05-23 DIAGNOSIS — I2510 Atherosclerotic heart disease of native coronary artery without angina pectoris: Secondary | ICD-10-CM | POA: Diagnosis not present

## 2015-05-23 DIAGNOSIS — I10 Essential (primary) hypertension: Secondary | ICD-10-CM | POA: Diagnosis not present

## 2015-05-24 DIAGNOSIS — L84 Corns and callosities: Secondary | ICD-10-CM | POA: Diagnosis not present

## 2015-05-24 DIAGNOSIS — L602 Onychogryphosis: Secondary | ICD-10-CM | POA: Diagnosis not present

## 2015-05-24 DIAGNOSIS — R319 Hematuria, unspecified: Secondary | ICD-10-CM | POA: Diagnosis not present

## 2015-05-24 DIAGNOSIS — N390 Urinary tract infection, site not specified: Secondary | ICD-10-CM | POA: Diagnosis not present

## 2015-05-24 DIAGNOSIS — E119 Type 2 diabetes mellitus without complications: Secondary | ICD-10-CM | POA: Diagnosis not present

## 2015-05-25 ENCOUNTER — Encounter: Payer: Self-pay | Admitting: Family Medicine

## 2015-05-25 DIAGNOSIS — I10 Essential (primary) hypertension: Secondary | ICD-10-CM | POA: Diagnosis not present

## 2015-05-25 DIAGNOSIS — I2510 Atherosclerotic heart disease of native coronary artery without angina pectoris: Secondary | ICD-10-CM | POA: Diagnosis not present

## 2015-05-25 DIAGNOSIS — E119 Type 2 diabetes mellitus without complications: Secondary | ICD-10-CM | POA: Diagnosis not present

## 2015-05-25 DIAGNOSIS — N390 Urinary tract infection, site not specified: Secondary | ICD-10-CM | POA: Insufficient documentation

## 2015-05-25 DIAGNOSIS — J449 Chronic obstructive pulmonary disease, unspecified: Secondary | ICD-10-CM | POA: Diagnosis not present

## 2015-05-28 DIAGNOSIS — I2510 Atherosclerotic heart disease of native coronary artery without angina pectoris: Secondary | ICD-10-CM | POA: Diagnosis not present

## 2015-05-28 DIAGNOSIS — I10 Essential (primary) hypertension: Secondary | ICD-10-CM | POA: Diagnosis not present

## 2015-05-28 DIAGNOSIS — E119 Type 2 diabetes mellitus without complications: Secondary | ICD-10-CM | POA: Diagnosis not present

## 2015-05-28 DIAGNOSIS — J449 Chronic obstructive pulmonary disease, unspecified: Secondary | ICD-10-CM | POA: Diagnosis not present

## 2015-05-29 DIAGNOSIS — I2510 Atherosclerotic heart disease of native coronary artery without angina pectoris: Secondary | ICD-10-CM | POA: Diagnosis not present

## 2015-05-29 DIAGNOSIS — E119 Type 2 diabetes mellitus without complications: Secondary | ICD-10-CM | POA: Diagnosis not present

## 2015-05-29 DIAGNOSIS — J449 Chronic obstructive pulmonary disease, unspecified: Secondary | ICD-10-CM | POA: Diagnosis not present

## 2015-05-29 DIAGNOSIS — I10 Essential (primary) hypertension: Secondary | ICD-10-CM | POA: Diagnosis not present

## 2015-05-30 DIAGNOSIS — I10 Essential (primary) hypertension: Secondary | ICD-10-CM | POA: Diagnosis not present

## 2015-05-30 DIAGNOSIS — E119 Type 2 diabetes mellitus without complications: Secondary | ICD-10-CM | POA: Diagnosis not present

## 2015-05-30 DIAGNOSIS — J449 Chronic obstructive pulmonary disease, unspecified: Secondary | ICD-10-CM | POA: Diagnosis not present

## 2015-05-30 DIAGNOSIS — I2510 Atherosclerotic heart disease of native coronary artery without angina pectoris: Secondary | ICD-10-CM | POA: Diagnosis not present

## 2015-05-30 DIAGNOSIS — J438 Other emphysema: Secondary | ICD-10-CM | POA: Diagnosis not present

## 2015-06-02 ENCOUNTER — Other Ambulatory Visit: Payer: Self-pay | Admitting: Family Medicine

## 2015-06-04 ENCOUNTER — Encounter: Payer: Self-pay | Admitting: Family Medicine

## 2015-06-04 ENCOUNTER — Ambulatory Visit (INDEPENDENT_AMBULATORY_CARE_PROVIDER_SITE_OTHER): Payer: Medicare Other | Admitting: Family Medicine

## 2015-06-04 VITALS — BP 121/60 | HR 92

## 2015-06-04 DIAGNOSIS — D509 Iron deficiency anemia, unspecified: Secondary | ICD-10-CM | POA: Diagnosis not present

## 2015-06-04 DIAGNOSIS — N183 Chronic kidney disease, stage 3 unspecified: Secondary | ICD-10-CM

## 2015-06-04 DIAGNOSIS — F0390 Unspecified dementia without behavioral disturbance: Secondary | ICD-10-CM

## 2015-06-04 DIAGNOSIS — I2510 Atherosclerotic heart disease of native coronary artery without angina pectoris: Secondary | ICD-10-CM | POA: Diagnosis not present

## 2015-06-04 DIAGNOSIS — J449 Chronic obstructive pulmonary disease, unspecified: Secondary | ICD-10-CM | POA: Diagnosis not present

## 2015-06-04 DIAGNOSIS — R4182 Altered mental status, unspecified: Secondary | ICD-10-CM

## 2015-06-04 DIAGNOSIS — E1122 Type 2 diabetes mellitus with diabetic chronic kidney disease: Secondary | ICD-10-CM | POA: Diagnosis not present

## 2015-06-04 DIAGNOSIS — I10 Essential (primary) hypertension: Secondary | ICD-10-CM | POA: Diagnosis not present

## 2015-06-04 DIAGNOSIS — E119 Type 2 diabetes mellitus without complications: Secondary | ICD-10-CM | POA: Diagnosis not present

## 2015-06-04 LAB — POCT GLYCOSYLATED HEMOGLOBIN (HGB A1C): Hemoglobin A1C: 7

## 2015-06-04 NOTE — Progress Notes (Signed)
CC: Mark Cooley is a 81 y.o. male is here for Hyperglycemia; Memory Loss; and Urinary Frequency   Subjective: HPI:  Family reports continued confusion Since I saw him last, definitive diagnosis significant improvement while he is on an antibiotic however once the antibiotic is stopped symptoms return. Symptoms  Include hallucinating cars and people around the house. He also frequently mentions his baby, something that doesn't exist. Family denies any frightening hallucinations. Family is not aware of any auditory hallucinations. There's been no falls since I saw him last.  He has been showing improvement with physical therapy now able to use his walker. Patient has no complaints today. There's been no shortness of breath, wheezing, cough, rash, fever, dysuria, nor diarrhea. Patient denies abdominal pain   Review Of Systems Outlined In HPI  Past Medical History  Diagnosis Date  . Heart disease   . Hypertension   . Hyperlipidemia   . Diabetes ([place])   . Asthma with COPD ([place])   . CAD (coronary artery disease)   . Skin cancer   . Hyperlipidemia LDL goal < 70 05/10/2012  . Essential hypertension 05/10/2012  . BPH (benign prostatic hyperplasia) 05/10/2012  . Coronary artery disease 05/10/2012  . COPD (chronic obstructive pulmonary disease) ([place]) 05/10/2012  . AAA (abdominal aortic aneurysm) ([place]) 05/10/2012  . Type 2 diabetes mellitus ([place]) 05/10/2012  . Alzheimer disease   . Macular degeneration     Past Surgical History  Procedure Laterality Date  . Eye transplant    . Remove gallstones    . Abdominal aortic aneurysm repair    . Removed gall bladder     Family History  Problem Relation Age of Onset  . Hypertension Father     Social History   Social History  . Marital Status: Married    Spouse Name: N/A  . Number of Children: N/A  . Years of Education: N/A   Occupational History  . Not on file.   Social History Main Topics  . Smoking status: Former Smoker -- 2.50  packs/day for 60 years    Types: Cigarettes    Quit date: 07/07/1989  . Smokeless tobacco: Never Used     Comment: quit 25 years ago  . Alcohol Use: No  . Drug Use: No  . Sexual Activity: Not on file   Other Topics Concern  . Not on file   Social History Narrative     Objective: BP 121/60 mmHg  Pulse 92  General: alert, disoriented to place, recognizes daughters however does not remember me. No Acute Distress HEENT: Pupils equal, round, reactive to light. Conjunctivae clear. Moist mucous membranes pharynx unremarkable Lungs: Clear to auscultation bilaterally, no wheezing/ronchi/rales.  Comfortable work of breathing. Good air movement. Cardiac: Regular rate and rhythm. Normal S1/S2.  No murmurs, rubs, nor gallops.   Extremities: No peripheral edema.  Strong peripheral pulses.  Mental Status: No depression, anxiety, nor agitation. Skin: Warm and dry.  Assessment & Plan: Mark Cooley was seen today for hyperglycemia, memory loss and urinary frequency.  Diagnoses and all orders for this visit:  Anemia, iron deficiency -     CBC  Type 2 diabetes mellitus with stage 3 chronic kidney disease, without long-term current use of insulin (HCC)  Altered mental status, unspecified altered mental status type -     CBC -     Ammonia -     COMPLETE METABOLIC PANEL WITH GFR -     TSH -     Urinalysis, Routine w reflex  microscopic (not at [facility]) -     Urine culture  Dementia, without behavioral disturbance -     Ambulatory referral to Home Health   Altered mental status: Labs as listed above, if labs are unremarkable showing no source of his altered mental status discussed the possibility of starting on a low dose of Keflex daily to see if this helps with the family's observation of him  Being more oriented while on antibiotic. Patient is now dependent in all ADLs except for able to dress himself. Home health has been asked to help out.  40 minutes spent face-to-face during visit today of  which at least 50% was counseling or coordinating care regarding: 1. Anemia, iron deficiency   2. Type 2 diabetes mellitus with stage 3 chronic kidney disease, without long-term current use of insulin ([place])   3. Altered mental status, unspecified altered mental status type   4. Dementia, without behavioral disturbance      Return in about 4 weeks (around 07/02/2015).

## 2015-06-04 NOTE — Addendum Note (Signed)
Addended by: Delrae Alfred on: 06/04/2015 01:10 PM   Modules accepted: Orders

## 2015-06-05 ENCOUNTER — Other Ambulatory Visit: Payer: Self-pay | Admitting: Family Medicine

## 2015-06-05 DIAGNOSIS — I2510 Atherosclerotic heart disease of native coronary artery without angina pectoris: Secondary | ICD-10-CM | POA: Diagnosis not present

## 2015-06-05 DIAGNOSIS — I10 Essential (primary) hypertension: Secondary | ICD-10-CM | POA: Diagnosis not present

## 2015-06-05 DIAGNOSIS — R4182 Altered mental status, unspecified: Secondary | ICD-10-CM | POA: Diagnosis not present

## 2015-06-05 DIAGNOSIS — D509 Iron deficiency anemia, unspecified: Secondary | ICD-10-CM | POA: Diagnosis not present

## 2015-06-05 DIAGNOSIS — E119 Type 2 diabetes mellitus without complications: Secondary | ICD-10-CM | POA: Diagnosis not present

## 2015-06-05 DIAGNOSIS — J449 Chronic obstructive pulmonary disease, unspecified: Secondary | ICD-10-CM | POA: Diagnosis not present

## 2015-06-05 LAB — COMPLETE METABOLIC PANEL WITH GFR
ALT: 13 U/L (ref 9–46)
AST: 16 U/L (ref 10–35)
Albumin: 4.1 g/dL (ref 3.6–5.1)
Alkaline Phosphatase: 52 U/L (ref 40–115)
BUN: 31 mg/dL — ABNORMAL HIGH (ref 7–25)
CHLORIDE: 100 mmol/L (ref 98–110)
CO2: 24 mmol/L (ref 20–31)
Calcium: 9.1 mg/dL (ref 8.6–10.3)
Creat: 1.09 mg/dL (ref 0.70–1.11)
GFR, EST AFRICAN AMERICAN: 69 mL/min (ref >=60)
GFR, EST NON AFRICAN AMERICAN: 59 mL/min — AB (ref >=60)
Glucose, Bld: 223 mg/dL — ABNORMAL HIGH (ref 65–99)
Potassium: 4.5 mmol/L (ref 3.5–5.3)
Sodium: 138 mmol/L (ref 135–146)
Total Bilirubin: 0.3 mg/dL (ref 0.2–1.2)
Total Protein: 6.4 g/dL (ref 6.1–8.1)

## 2015-06-05 LAB — CBC
HCT: 34.2 % — ABNORMAL LOW (ref 39.0–52.0)
Hemoglobin: 11 g/dL — ABNORMAL LOW (ref 13.0–17.0)
MCH: 26.2 pg (ref 26.0–34.0)
MCHC: 32.2 g/dL (ref 30.0–36.0)
MCV: 81.4 fL (ref 78.0–100.0)
MPV: 9.7 fL (ref 8.6–12.4)
PLATELETS: 126 10*3/uL — AB (ref 150–400)
RBC: 4.2 MIL/uL — ABNORMAL LOW (ref 4.22–5.81)
RDW: 21.7 % — AB (ref 11.5–15.5)
WBC: 5.3 10*3/uL (ref 4.0–10.5)

## 2015-06-05 LAB — AMMONIA: Ammonia: 22 umol/L (ref 16–53)

## 2015-06-05 LAB — TSH: TSH: 2.443 u[IU]/mL (ref 0.350–4.500)

## 2015-06-06 DIAGNOSIS — I10 Essential (primary) hypertension: Secondary | ICD-10-CM | POA: Diagnosis not present

## 2015-06-06 DIAGNOSIS — I2510 Atherosclerotic heart disease of native coronary artery without angina pectoris: Secondary | ICD-10-CM | POA: Diagnosis not present

## 2015-06-06 DIAGNOSIS — J449 Chronic obstructive pulmonary disease, unspecified: Secondary | ICD-10-CM | POA: Diagnosis not present

## 2015-06-06 DIAGNOSIS — E119 Type 2 diabetes mellitus without complications: Secondary | ICD-10-CM | POA: Diagnosis not present

## 2015-06-06 LAB — URINALYSIS, ROUTINE W REFLEX MICROSCOPIC
Bilirubin Urine: NEGATIVE
Ketones, ur: NEGATIVE
LEUKOCYTES UA: NEGATIVE
NITRITE: NEGATIVE
PH: 6 (ref 5.0–8.0)
SPECIFIC GRAVITY, URINE: 1.022 (ref 1.001–1.035)

## 2015-06-06 LAB — URINALYSIS, MICROSCOPIC ONLY
Bacteria, UA: NONE SEEN [HPF]
CASTS: NONE SEEN [LPF]
CRYSTALS: NONE SEEN [HPF]
Yeast: NONE SEEN [HPF]

## 2015-06-07 DIAGNOSIS — J449 Chronic obstructive pulmonary disease, unspecified: Secondary | ICD-10-CM | POA: Diagnosis not present

## 2015-06-07 DIAGNOSIS — E119 Type 2 diabetes mellitus without complications: Secondary | ICD-10-CM | POA: Diagnosis not present

## 2015-06-07 DIAGNOSIS — I10 Essential (primary) hypertension: Secondary | ICD-10-CM | POA: Diagnosis not present

## 2015-06-07 DIAGNOSIS — I2510 Atherosclerotic heart disease of native coronary artery without angina pectoris: Secondary | ICD-10-CM | POA: Diagnosis not present

## 2015-06-07 LAB — URINE CULTURE: Colony Count: 30000

## 2015-06-08 ENCOUNTER — Telehealth: Payer: Self-pay

## 2015-06-08 NOTE — Telephone Encounter (Signed)
An aid from [facility] called reporting that Mark Cooley does not need a in home nurse or social worker assistance.  She stated that the daughter has hired a Charity fundraiser to be him so many hours out the day.

## 2015-06-11 DIAGNOSIS — C679 Malignant neoplasm of bladder, unspecified: Secondary | ICD-10-CM | POA: Diagnosis not present

## 2015-06-11 DIAGNOSIS — N390 Urinary tract infection, site not specified: Secondary | ICD-10-CM | POA: Diagnosis not present

## 2015-06-11 DIAGNOSIS — R3129 Other microscopic hematuria: Secondary | ICD-10-CM | POA: Diagnosis not present

## 2015-06-11 DIAGNOSIS — R319 Hematuria, unspecified: Secondary | ICD-10-CM | POA: Diagnosis not present

## 2015-06-11 DIAGNOSIS — D494 Neoplasm of unspecified behavior of bladder: Secondary | ICD-10-CM | POA: Diagnosis not present

## 2015-06-14 ENCOUNTER — Telehealth: Payer: Self-pay | Admitting: Family Medicine

## 2015-06-14 DIAGNOSIS — D494 Neoplasm of unspecified behavior of bladder: Secondary | ICD-10-CM | POA: Insufficient documentation

## 2015-06-14 MED ORDER — CEPHALEXIN 250 MG PO CAPS: 250.0000 mg | ORAL_CAPSULE | Freq: Every day | ORAL | Status: AC

## 2015-06-14 NOTE — Telephone Encounter (Signed)
Trial of daily antibiotic to reduce UTIs

## 2015-06-28 DIAGNOSIS — Z888 Allergy status to other drugs, medicaments and biological substances status: Secondary | ICD-10-CM | POA: Diagnosis not present

## 2015-06-28 DIAGNOSIS — G319 Degenerative disease of nervous system, unspecified: Secondary | ICD-10-CM | POA: Diagnosis not present

## 2015-06-28 DIAGNOSIS — R05 Cough: Secondary | ICD-10-CM | POA: Diagnosis not present

## 2015-06-28 DIAGNOSIS — Z79899 Other long term (current) drug therapy: Secondary | ICD-10-CM | POA: Diagnosis not present

## 2015-06-28 DIAGNOSIS — Z7984 Long term (current) use of oral hypoglycemic drugs: Secondary | ICD-10-CM | POA: Diagnosis not present

## 2015-06-28 DIAGNOSIS — Z87891 Personal history of nicotine dependence: Secondary | ICD-10-CM | POA: Diagnosis not present

## 2015-06-28 DIAGNOSIS — E785 Hyperlipidemia, unspecified: Secondary | ICD-10-CM | POA: Diagnosis not present

## 2015-06-28 DIAGNOSIS — G309 Alzheimer's disease, unspecified: Secondary | ICD-10-CM | POA: Diagnosis not present

## 2015-06-28 DIAGNOSIS — E119 Type 2 diabetes mellitus without complications: Secondary | ICD-10-CM | POA: Diagnosis not present

## 2015-06-28 DIAGNOSIS — F419 Anxiety disorder, unspecified: Secondary | ICD-10-CM | POA: Diagnosis not present

## 2015-06-28 DIAGNOSIS — M6281 Muscle weakness (generalized): Secondary | ICD-10-CM | POA: Diagnosis not present

## 2015-06-28 DIAGNOSIS — Z7951 Long term (current) use of inhaled steroids: Secondary | ICD-10-CM | POA: Diagnosis not present

## 2015-06-28 DIAGNOSIS — I10 Essential (primary) hypertension: Secondary | ICD-10-CM | POA: Diagnosis not present

## 2015-06-28 DIAGNOSIS — J449 Chronic obstructive pulmonary disease, unspecified: Secondary | ICD-10-CM | POA: Diagnosis not present

## 2015-06-28 DIAGNOSIS — R4182 Altered mental status, unspecified: Secondary | ICD-10-CM | POA: Diagnosis not present

## 2015-06-28 DIAGNOSIS — Z7982 Long term (current) use of aspirin: Secondary | ICD-10-CM | POA: Diagnosis not present

## 2015-06-28 DIAGNOSIS — F0281 Dementia in other diseases classified elsewhere with behavioral disturbance: Secondary | ICD-10-CM | POA: Diagnosis not present

## 2015-06-29 DIAGNOSIS — J438 Other emphysema: Secondary | ICD-10-CM | POA: Diagnosis not present

## 2015-07-06 ENCOUNTER — Encounter: Payer: Self-pay | Admitting: Family Medicine

## 2015-07-06 ENCOUNTER — Telehealth: Payer: Self-pay | Admitting: Family Medicine

## 2015-07-06 ENCOUNTER — Ambulatory Visit (INDEPENDENT_AMBULATORY_CARE_PROVIDER_SITE_OTHER): Payer: Medicare Other | Admitting: Family Medicine

## 2015-07-06 VITALS — BP 136/69 | HR 85

## 2015-07-06 DIAGNOSIS — J449 Chronic obstructive pulmonary disease, unspecified: Secondary | ICD-10-CM | POA: Diagnosis not present

## 2015-07-06 DIAGNOSIS — H540 Blindness, both eyes: Secondary | ICD-10-CM | POA: Diagnosis not present

## 2015-07-06 DIAGNOSIS — R4182 Altered mental status, unspecified: Secondary | ICD-10-CM | POA: Insufficient documentation

## 2015-07-06 DIAGNOSIS — G308 Other Alzheimer's disease: Secondary | ICD-10-CM | POA: Diagnosis not present

## 2015-07-06 DIAGNOSIS — F0281 Dementia in other diseases classified elsewhere with behavioral disturbance: Secondary | ICD-10-CM

## 2015-07-06 DIAGNOSIS — N390 Urinary tract infection, site not specified: Secondary | ICD-10-CM | POA: Diagnosis not present

## 2015-07-06 DIAGNOSIS — E1129 Type 2 diabetes mellitus with other diabetic kidney complication: Secondary | ICD-10-CM | POA: Diagnosis not present

## 2015-07-06 DIAGNOSIS — N183 Chronic kidney disease, stage 3 (moderate): Secondary | ICD-10-CM

## 2015-07-06 DIAGNOSIS — R5383 Other fatigue: Secondary | ICD-10-CM

## 2015-07-06 DIAGNOSIS — H543 Unqualified visual loss, both eyes: Secondary | ICD-10-CM

## 2015-07-06 DIAGNOSIS — E1122 Type 2 diabetes mellitus with diabetic chronic kidney disease: Secondary | ICD-10-CM

## 2015-07-06 DIAGNOSIS — R410 Disorientation, unspecified: Secondary | ICD-10-CM

## 2015-07-06 DIAGNOSIS — I714 Abdominal aortic aneurysm, without rupture, unspecified: Secondary | ICD-10-CM

## 2015-07-06 LAB — CBC
HCT: 30.9 % — ABNORMAL LOW (ref 39.0–52.0)
Hemoglobin: 9.6 g/dL — ABNORMAL LOW (ref 13.0–17.0)
MCH: 26.8 pg (ref 26.0–34.0)
MCHC: 31.1 g/dL (ref 30.0–36.0)
MCV: 86.3 fL (ref 78.0–100.0)
MPV: 9.7 fL (ref 8.6–12.4)
PLATELETS: 131 10*3/uL — AB (ref 150–400)
RBC: 3.58 MIL/uL — ABNORMAL LOW (ref 4.22–5.81)
RDW: 19 % — AB (ref 11.5–15.5)
WBC: 6.1 10*3/uL (ref 4.0–10.5)

## 2015-07-06 LAB — POCT URINALYSIS DIPSTICK
Bilirubin, UA: NEGATIVE
Glucose, UA: 500
Ketones, UA: NEGATIVE
Leukocytes, UA: NEGATIVE
NITRITE UA: NEGATIVE
PH UA: 5.5
PROTEIN UA: 30
SPEC GRAV UA: 1.02
UROBILINOGEN UA: 0.2

## 2015-07-06 LAB — COMPREHENSIVE METABOLIC PANEL
ALT: 17 U/L (ref 9–46)
AST: 15 U/L (ref 10–35)
Albumin: 3.7 g/dL (ref 3.6–5.1)
Alkaline Phosphatase: 53 U/L (ref 40–115)
BILIRUBIN TOTAL: 0.4 mg/dL (ref 0.2–1.2)
BUN: 27 mg/dL — ABNORMAL HIGH (ref 7–25)
CALCIUM: 8.8 mg/dL (ref 8.6–10.3)
CO2: 23 mmol/L (ref 20–31)
Chloride: 102 mmol/L (ref 98–110)
Creat: 1.23 mg/dL — ABNORMAL HIGH (ref 0.70–1.11)
Glucose, Bld: 512 mg/dL (ref 65–99)
Potassium: 4.1 mmol/L (ref 3.5–5.3)
SODIUM: 137 mmol/L (ref 135–146)
Total Protein: 6.2 g/dL (ref 6.1–8.1)

## 2015-07-06 LAB — LIPASE: Lipase: 22 U/L (ref 7–60)

## 2015-07-06 MED ORDER — PREDNISONE 10 MG PO TABS: 20.0000 mg | ORAL_TABLET | Freq: Every day | ORAL | Status: DC

## 2015-07-06 NOTE — Assessment & Plan Note (Signed)
Change in baseline. Unclear etiology at this time. I think is most likely due to COPD exacerbation. Prescribe small dose prednisone and use albuterol nebulizer. Watchful waiting.  Discussed in detail safety plan and goals of care plan. In general if patient worsens dramatically over the weekend hospice services will be arranged. Avoid hospitalization at all cost per family request.

## 2015-07-06 NOTE — Patient Instructions (Signed)
I am not sure what is wrong.  I am on call this week.  Let me know if Mark Cooley is worsening. We can start hospice services.  Take prednisone daily.  Use the nebulizer with the mask.  Return soon.

## 2015-07-06 NOTE — Telephone Encounter (Signed)
I received a critical value lab result call.  Mr Mark Cooley's glucose level is 500.  This probably explains his change in mental status. I discussed the results with his daughter on the phone tonight.  Plan for tomorrow is to go to Urgent care to start low dose long acting insulin.  He will f/u Tuesday in primary care.  His daughter states that Mr Mark Cooley is quite demented and "freaks out" at the mention of insulin. He is apparently OK with the idea of vitamin shots.  I will be available for discussion tomorrow on my Cell phone [phone] if you have any questions.  I also recommend that he stop the prednisone that I sent in.

## 2015-07-06 NOTE — Progress Notes (Signed)
Mark Cooley is a 81 y.o. male who presents to [facility]: Primary Care today for fatigue and mental status change. Patient has a history of Alzheimer's dementia and blindness. He has poor general health overall. He's had a change in his baseline recently. Over the past few days he's been more fatigued than usual. He had trouble standing yesterday. Today he standing a bit better. Patient denies any change in pain. He does have a history of recurrent UTI and his family is concerned he may have a UTI. He did fall out of bed last night and hit his nose on a bedside table. His mental status changes happen before the fall. The family notes that the patient has a DO NOT RESUSCITATE. They very much would like to avoid a hospitalization if possible. If it's obvious that he has a very serious medical condition that will very likely result in his death the near future they would prefer to start hospice services at home instead of transportation to the emergency room. Home health services are already in place and family is capable of providing 24-hour supervision.   Past Medical History  Diagnosis Date  . Heart disease   . Hypertension   . Hyperlipidemia   . Diabetes ([place])   . Asthma with COPD ([place])   . CAD (coronary artery disease)   . Skin cancer   . Hyperlipidemia LDL goal < 70 05/10/2012  . Essential hypertension 05/10/2012  . BPH (benign prostatic hyperplasia) 05/10/2012  . Coronary artery disease 05/10/2012  . COPD (chronic obstructive pulmonary disease) ([place]) 05/10/2012  . AAA (abdominal aortic aneurysm) ([place]) 05/10/2012  . Type 2 diabetes mellitus ([place]) 05/10/2012  . Alzheimer disease   . Macular degeneration    Past Surgical History  Procedure Laterality Date  . Eye transplant    . Remove gallstones    . Abdominal aortic aneurysm repair    . Removed gall bladder     Social History  Substance Use  Topics  . Smoking status: Former Smoker -- 2.50 packs/day for 60 years    Types: Cigarettes    Quit date: 07/07/1989  . Smokeless tobacco: Never Used     Comment: quit 25 years ago  . Alcohol Use: No   family history includes Hypertension in his father.  ROS as above Medications: Current Outpatient Prescriptions  Medication Sig Dispense Refill  . albuterol (PROVENTIL HFA;VENTOLIN HFA) 108 (90 BASE) MCG/ACT inhaler Inhale 2 puffs into the lungs every 6 (six) hours as needed for wheezing or shortness of breath. 3 Inhaler 11  . ALPRAZolam (XANAX) 0.5 MG tablet Take 0.5 mg by mouth at bedtime as needed for anxiety.    . AMBULATORY NON FORMULARY MEDICATION Home filled oxygen tank Dx COPD 496 1 each 0  . AMBULATORY NON FORMULARY MEDICATION Nebulizer machine  [id number] 496 1 each 0  . AMBULATORY NON FORMULARY MEDICATION Knee high compression stockings at a pressure of 20-64mmHg to be worn daily. Fit to size. Dx Edema R60.9 2 Units 1  . aspirin 81 MG tablet Take 81 mg by mouth daily.    . Calcium Carb-Cholecalciferol (CALCIUM + D3) 600-200 MG-UNIT TABS Take by mouth daily.    . cephALEXin (KEFLEX) 250 MG capsule Take 1 capsule (250 mg total) by mouth daily. 45 capsule 2  . CRESTOR 20 MG tablet TAKE 1 TABLET (20 MG TOTAL) BY MOUTH DAILY.,DUE 12-26-14 90 tablet 0  . diphenhydramine-acetaminophen (TYLENOL PM) 25-500 MG TABS Take  1 tablet by mouth at bedtime as needed.    . ferrous sulfate 325 (65 FE) MG EC tablet Take 1 tablet (325 mg total) by mouth daily with breakfast.  3  . fluoruracil (CARAC) 0.5 % cream Apply topically daily.    . fluticasone (FLONASE) 50 MCG/ACT nasal spray Place 2 sprays into both nostrils daily. 16 g 1  . Fluticasone-Salmeterol (ADVAIR DISKUS) 500-50 MCG/DOSE AEPB Inhale 1 puff into the lungs 2 (two) times daily. 180 each 2  . glipiZIDE-metformin (METAGLIP) 5-500 MG tablet TAKE 2 TABLETS BY MOUTH 2 (TWO) TIMES DAILY BEFORE A MEAL. 360 tablet 0  . Glucose Blood (TRUETRACK  TEST VI)     . memantine (NAMENDA) 10 MG tablet TAKE 1 TABLET (10 MG TOTAL) BY MOUTH 2 (TWO) TIMES DAILY. 180 tablet 1  . nitroGLYCERIN (NITROSTAT) 0.4 MG SL tablet Place 1 tablet (0.4 mg total) under the tongue every 5 (five) minutes as needed for chest pain. 45 tablet 12  . olopatadine (PATANOL) 0.1 % ophthalmic solution Place 1 drop into both eyes 2 (two) times daily. 5 mL 12  . Omega-3 Fatty Acids (FISH OIL) 1000 MG CAPS Take by mouth.    . Oxygen Permeable Lens Products SOLN by Does not apply route.    Marland Kitchen PARoxetine (PAXIL) 30 MG tablet Take 1 tablet (30 mg total) by mouth daily. 90 tablet 1  . rivastigmine (EXELON) 9.5 mg/24hr Place 1 patch (9.5 mg total) onto the skin daily. 90 patch 1  . Tiotropium Bromide Monohydrate (SPIRIVA RESPIMAT) 2.5 MCG/ACT AERS One inhalation a day. 3 Inhaler 3  . traZODone (DESYREL) 50 MG tablet Take 1.5 tablets (75 mg total) by mouth at bedtime as needed for sleep. 135 tablet 1  . predniSONE (DELTASONE) 10 MG tablet Take 2 tablets (20 mg total) by mouth daily. 10 tablet 0   No current facility-administered medications for this visit.   Allergies  Allergen Reactions  . Budesonide     Cough; D/c (Dr Percell Miller) lung md  . Donepezil     Diarrhea     Exam:  BP 136/69 mmHg  Pulse 85  SpO2 96% Gen: Well NAD confused but nontoxic appearing HEENT: EOMI,  MMM Lungs: Normal work of breathing. Coarse breath sounds bilaterally with prolonged expiratory phase. Heart: RRR no MRG Abd: NABS, Soft. Nondistended, Nontender Exts: Brisk capillary refill, warm and well perfused.  Neuro: Alert but not oriented.   Results for orders placed or performed in visit on 07/06/15 (from the past 24 hour(s))  POCT Urinalysis Dipstick     Status: None   Collection Time: 07/06/15 11:04 AM  Result Value Ref Range   Color, UA yellow    Clarity, UA clear    Glucose, UA 500    Bilirubin, UA neg    Ketones, UA neg    Spec Grav, UA 1.020    Blood, UA mod    pH, UA 5.5     Protein, UA 30    Urobilinogen, UA 0.2    Nitrite, UA neg    Leukocytes, UA Negative Negative   No results found.   Please see individual assessment and plan sections.  I spent more than 40 minutes with this patient with more than  50% was spent face-to-face time.

## 2015-07-07 ENCOUNTER — Emergency Department
Admission: EM | Admit: 2015-07-07 | Discharge: 2015-07-07 | Disposition: A | Discharge status: Home / Self Care | Payer: Medicare Other | Source: Home / Self Care | Attending: Family Medicine | Admitting: Family Medicine

## 2015-07-07 ENCOUNTER — Encounter: Payer: Self-pay | Admitting: Emergency Medicine

## 2015-07-07 DIAGNOSIS — R739 Hyperglycemia, unspecified: Secondary | ICD-10-CM

## 2015-07-07 LAB — FERRITIN: FERRITIN: 36 ng/mL (ref 22–322)

## 2015-07-07 LAB — POCT FASTING CBG KUC MANUAL ENTRY: POCT Glucose (KUC): 327 mg/dL — AB (ref 70–99)

## 2015-07-07 NOTE — ED Notes (Signed)
Pt was seen @ PCP yesterday and was called today for an elevated blood sugar of 500.  Here for recheck.

## 2015-07-07 NOTE — ED Provider Notes (Signed)
CSN: [id number]     Arrival date & time 07/07/15  1142 History   First MD Initiated Contact with Patient 07/07/15 1206     Chief Complaint  Patient presents with  . Hyperglycemia   (Consider location/radiation/quality/duration/timing/severity/associated sxs/prior Treatment) HPI Pt is a 81yo male with hx of Alzheimer's disease brought to [facility] by his family members per request of his PCP, Dr. Georgina Snell, for recheck of blood sugar as it was elevated to 500 yesterday.  Pt has hx of dementia, majority of hx provided by family members. Pt had been more fatigued and slightly more altered mental status over the last week after being home for a stay in the hospital 1 week ago.  Family notes he did have a large bowl of honey nut cheerios yesterday before his visit.  Blood work and urine was collected yesterday.  This morning, pt was acting baseline, energetic enough to shower and bath shortly after waking this morning. Pt also had breakfast, which included eggs.  He has since become slightly more fatigued as the day has gone on.  Pt does c/o body aches from time to time, however, is inconsistent with what hurts and when.    Past Medical History  Diagnosis Date  . Heart disease   . Hypertension   . Hyperlipidemia   . Diabetes ([place])   . Asthma with COPD ([place])   . CAD (coronary artery disease)   . Skin cancer   . Hyperlipidemia LDL goal < 70 05/10/2012  . Essential hypertension 05/10/2012  . BPH (benign prostatic hyperplasia) 05/10/2012  . Coronary artery disease 05/10/2012  . COPD (chronic obstructive pulmonary disease) ([place]) 05/10/2012  . AAA (abdominal aortic aneurysm) ([place]) 05/10/2012  . Type 2 diabetes mellitus ([place]) 05/10/2012  . Alzheimer disease   . Macular degeneration    Past Surgical History  Procedure Laterality Date  . Eye transplant    . Remove gallstones    . Abdominal aortic aneurysm repair    . Removed gall bladder     Family History  Problem Relation Age of Onset  . Hypertension  Father    Social History  Substance Use Topics  . Smoking status: Former Smoker -- 2.50 packs/day for 60 years    Types: Cigarettes    Quit date: 07/07/1989  . Smokeless tobacco: Never Used     Comment: quit 25 years ago  . Alcohol Use: No    Review of Systems  Constitutional: Negative for fever and chills.  HENT: Negative for congestion, rhinorrhea and sore throat.   Respiratory: Negative for cough and shortness of breath.   Gastrointestinal: Negative for nausea, vomiting and diarrhea.  Psychiatric/Behavioral: Positive for confusion. Negative for behavioral problems, sleep disturbance and agitation.    Allergies  Budesonide and Donepezil  Home Medications   Prior to Admission medications   Medication Sig Start Date End Date Taking? Authorizing Provider  albuterol (PROVENTIL HFA;VENTOLIN HFA) 108 (90 BASE) MCG/ACT inhaler Inhale 2 puffs into the lungs every 6 (six) hours as needed for wheezing or shortness of breath. 08/24/14   Marcial Pacas, DO  ALPRAZolam (XANAX) 0.5 MG tablet Take 0.5 mg by mouth at bedtime as needed for anxiety.    Historical Provider, MD  AMBULATORY NON [place] filled oxygen tank Dx COPD 496 10/29/12   Marcial Pacas, DO  AMBULATORY NON FORMULARY MEDICATION Nebulizer machine  [id number] 11/01/12   Sean Hommel, DO  AMBULATORY NON FORMULARY MEDICATION Knee high compression stockings at a pressure of 20-55mmHg to  be worn daily. Fit to size. Dx Edema R60.9 10/06/14   Marcial Pacas, DO  aspirin 81 MG tablet Take 81 mg by mouth daily.    Historical Provider, MD  Calcium Carb-Cholecalciferol (CALCIUM + D3) 600-200 MG-UNIT TABS Take by mouth daily.    Historical Provider, MD  cephALEXin (KEFLEX) 250 MG capsule Take 1 capsule (250 mg total) by mouth daily. 06/14/15   Sean Hommel, DO  CRESTOR 20 MG tablet TAKE 1 TABLET (20 MG TOTAL) BY MOUTH DAILY.,DUE 12-26-14 05/17/15   Marcial Pacas, DO  diphenhydramine-acetaminophen (TYLENOL PM) 25-500 MG TABS Take 1 tablet by  mouth at bedtime as needed.    Historical Provider, MD  ferrous sulfate 325 (65 FE) MG EC tablet Take 1 tablet (325 mg total) by mouth daily with breakfast. 05/03/15   Marcial Pacas, DO  fluoruracil (CARAC) 0.5 % cream Apply topically daily.    Historical Provider, MD  fluticasone (FLONASE) 50 MCG/ACT nasal spray Place 2 sprays into both nostrils daily. 05/27/13   Jade L Breeback, PA-C  Fluticasone-Salmeterol (ADVAIR DISKUS) 500-50 MCG/DOSE AEPB Inhale 1 puff into the lungs 2 (two) times daily. 11/06/14   Sean Hommel, DO  glipiZIDE-metformin (METAGLIP) 5-500 MG tablet TAKE 2 TABLETS BY MOUTH 2 (TWO) TIMES DAILY BEFORE A MEAL. 06/04/15   Sean Hommel, DO  Glucose Blood (TRUETRACK TEST VI)     Historical Provider, MD  memantine (NAMENDA) 10 MG tablet TAKE 1 TABLET (10 MG TOTAL) BY MOUTH 2 (TWO) TIMES DAILY. 08/24/14   Marcial Pacas, DO  nitroGLYCERIN (NITROSTAT) 0.4 MG SL tablet Place 1 tablet (0.4 mg total) under the tongue every 5 (five) minutes as needed for chest pain. 11/06/14   Sean Hommel, DO  olopatadine (PATANOL) 0.1 % ophthalmic solution Place 1 drop into both eyes 2 (two) times daily. 09/26/14   Marcial Pacas, DO  Omega-3 Fatty Acids (FISH OIL) 1000 MG CAPS Take by mouth.    Historical Provider, MD  Oxygen Permeable Lens Products SOLN by Does not apply route.    Historical Provider, MD  PARoxetine (PAXIL) 30 MG tablet Take 1 tablet (30 mg total) by mouth daily. 02/06/15   Sean Hommel, DO  predniSONE (DELTASONE) 10 MG tablet Take 2 tablets (20 mg total) by mouth daily. 07/06/15   Gregor Hams, MD  rivastigmine (EXELON) 9.5 mg/24hr Place 1 patch (9.5 mg total) onto the skin daily. 08/24/14   Marcial Pacas, DO  Tiotropium Bromide Monohydrate (SPIRIVA RESPIMAT) 2.5 MCG/ACT AERS One inhalation a day. 08/24/14   Marcial Pacas, DO  traZODone (DESYREL) 50 MG tablet Take 1.5 tablets (75 mg total) by mouth at bedtime as needed for sleep. 03/01/15   Marcial Pacas, DO   Meds Ordered and Administered this Visit  Medications  - No data to display  BP 153/74 mmHg  Pulse 84  Temp(Src) 97.4 F (36.3 C) (Oral)  SpO2 97% No data found.   Physical Exam  Constitutional: He appears well-developed and well-nourished.  Frail elderly male sitting in wheelchair. No acute distress.  HENT:  Head: Normocephalic and atraumatic.  Eyes: Conjunctivae are normal. No scleral icterus.  Neck: Normal range of motion. Neck supple.  Cardiovascular: Normal rate, regular rhythm and normal heart sounds.   Pulmonary/Chest: Effort normal. No respiratory distress. He has wheezes. He has no rales. He exhibits no tenderness.  Faint expiratory wheeze in lower lung fields. No rhonchi. No evidence of respiratory distress.  Abdominal: Soft. He exhibits no distension and no mass. There is no tenderness. There is  no rebound and no guarding.  Musculoskeletal: Normal range of motion.  Neurological: He is alert.  Alert, able to answer "yes" or "no" questions and follow some commands.   Skin: Skin is warm and dry.  Nursing note and vitals reviewed.   ED Course  Procedures (including critical care time)  Labs Review Labs Reviewed  POCT FASTING CBG [facility] MANUAL ENTRY - Abnormal; Notable for the following:    POCT Glucose ([facility]) 327 (*)    All other components within normal limits    Imaging Review No results found.    MDM   1. Hyperglycemia    Pt brought to [facility] by family per request of Dr. Georgina Snell, PCP, for [facility] recheck as it was 500 yesterday. CBG today- 327 Pt is alert and not signs of distress. Consulted with Dr. Georgina Snell, agrees with no change in pt's medications. Will continue to hold off on prednisone that was initially prescribed for his faint wheezing.  And will hold off on insulin today.   Encouraged family to call Tuesday to schedule f/u appointment with PCP. Return precautions provided. Family verbalized understanding and agreement with tx plan.     Noland Fordyce, PA-C 07/07/15 1309

## 2015-07-08 LAB — URINE CULTURE

## 2015-07-09 ENCOUNTER — Telehealth: Payer: Self-pay | Admitting: *Deleted

## 2015-07-11 ENCOUNTER — Telehealth: Payer: Self-pay | Admitting: Family Medicine

## 2015-07-11 NOTE — Telephone Encounter (Signed)
[place], Will you please ask this patient's family how he's doing, I heard that he was seen by one of my partners last week while I was on vacation.

## 2015-07-11 NOTE — Telephone Encounter (Signed)
Daughter stated that Mark Cooley is sleeping a lot and she had several questions regaurding to her dads health.  Ivin Booty advised to make appointment to discuss with Dr. Ileene Rubens.

## 2015-07-12 ENCOUNTER — Ambulatory Visit: Payer: Medicare Other | Admitting: Adult Health

## 2015-07-16 ENCOUNTER — Ambulatory Visit (INDEPENDENT_AMBULATORY_CARE_PROVIDER_SITE_OTHER): Payer: Medicare Other

## 2015-07-16 ENCOUNTER — Ambulatory Visit (INDEPENDENT_AMBULATORY_CARE_PROVIDER_SITE_OTHER): Payer: Medicare Other | Admitting: Family Medicine

## 2015-07-16 ENCOUNTER — Encounter: Payer: Self-pay | Admitting: Family Medicine

## 2015-07-16 VITALS — BP 133/78 | HR 82

## 2015-07-16 DIAGNOSIS — L309 Dermatitis, unspecified: Secondary | ICD-10-CM | POA: Diagnosis not present

## 2015-07-16 DIAGNOSIS — M79602 Pain in left arm: Secondary | ICD-10-CM

## 2015-07-16 DIAGNOSIS — M25512 Pain in left shoulder: Secondary | ICD-10-CM

## 2015-07-16 MED ORDER — TRIAMCINOLONE ACETONIDE 0.1 % EX CREA: TOPICAL_CREAM | CUTANEOUS | Status: DC

## 2015-07-16 NOTE — Progress Notes (Signed)
CC: Mark Cooley is a 82 y.o. male is here for Hyperglycemia   Subjective: HPI:   accompanied by daughter and son-in-law  Family reports that he is back to his normal self ever since they started restricting his carbohydrate intake. When his blood sugars above 300 he gets confused and somewhat combative. Blood sugars at home are ranging somewhere between 120 and 140 regardless of fasting or nonfasting.  Patient's only complaint today is left arm pain localized in the middle of the humerus which has been present for the past 2 weeks. It began when he actually rolled out of his bed and landed on the floor. He was able to get back up and go to sleep immediately. He is not sure what makes it better or worse. It's mild in severity. He denies any overlying skin changes. He denies joint pain elsewhere.   Family points out aRash on the right nose. It's been present for a few weeks. It's worse the more he picks at it. He tells me it bothers him so he picks at it.   Review Of Systems Outlined In HPI  Past Medical History  Diagnosis Date  . Heart disease   . Hypertension   . Hyperlipidemia   . Diabetes ([place])   . Asthma with COPD ([place])   . CAD (coronary artery disease)   . Skin cancer   . Hyperlipidemia LDL goal < 70 05/10/2012  . Essential hypertension 05/10/2012  . BPH (benign prostatic hyperplasia) 05/10/2012  . Coronary artery disease 05/10/2012  . COPD (chronic obstructive pulmonary disease) ([place]) 05/10/2012  . AAA (abdominal aortic aneurysm) ([place]) 05/10/2012  . Type 2 diabetes mellitus ([place]) 05/10/2012  . Alzheimer disease   . Macular degeneration     Past Surgical History  Procedure Laterality Date  . Eye transplant    . Remove gallstones    . Abdominal aortic aneurysm repair    . Removed gall bladder     Family History  Problem Relation Age of Onset  . Hypertension Father     Social History   Social History  . Marital Status: Married    Spouse Name: N/A  . Number of  Children: N/A  . Years of Education: N/A   Occupational History  . Not on file.   Social History Main Topics  . Smoking status: Former Smoker -- 2.50 packs/day for 60 years    Types: Cigarettes    Quit date: 07/07/1989  . Smokeless tobacco: Never Used     Comment: quit 25 years ago  . Alcohol Use: No  . Drug Use: No  . Sexual Activity: Not on file   Other Topics Concern  . Not on file   Social History Narrative     Objective: BP 133/78 mmHg  Pulse 82  General: Alert and Oriented, No Acute Distress HEENT: Pupils equal, round, reactive to light. Conjunctivae clear.  Moist mucousmembranes Lungs: Clear to auscultation bilaterally, no wheezing/ronchi/rales.  Comfortable work of breathing. Good air movement. Cardiac: Regular rate and rhythm. Normal S1/S2.  No murmurs, rubs, nor gallops.   Left shoulder exam: Full range of motion and strength in the shoulder. No pain in the shoulder however he does have reproducible pain with compression of the mid shaft of the lateral humerus. Extremities: No peripheral edema.  Strong peripheral pulses.  Mental Status: No depression, anxiety, nor agitation. Skin: Warm and dry. Mild erythema and flaking on the right side of the nose approximately half centimeter in diameter  Assessment &  Plan: Mark Cooley was seen today for hyperglycemia.  Diagnoses and all orders for this visit:  Dermatitis -     triamcinolone cream (KENALOG) 0.1 %; Apply to affected areas twice a day for up to two weeks, avoid face.  Left arm pain -     DG Humerus Left; Future   Dermatitis: On the nose, start triamcinolone cream and follow-up in one month if not resolved. Left arm pain: Obtaining humerus films to rule out hairline fracture.  25 minutes spent face-to-face during visit today of which at least 50% was counseling or coordinating care regarding: 1. Dermatitis   2. Left arm pain      Return if symptoms worsen or fail to improve.

## 2015-07-20 DIAGNOSIS — G309 Alzheimer's disease, unspecified: Secondary | ICD-10-CM | POA: Diagnosis not present

## 2015-07-24 ENCOUNTER — Other Ambulatory Visit: Payer: Self-pay | Admitting: Family Medicine

## 2015-07-30 DIAGNOSIS — J438 Other emphysema: Secondary | ICD-10-CM | POA: Diagnosis not present

## 2015-08-01 ENCOUNTER — Ambulatory Visit (INDEPENDENT_AMBULATORY_CARE_PROVIDER_SITE_OTHER): Payer: Medicare Other | Admitting: Family Medicine

## 2015-08-01 VITALS — BP 136/70 | HR 86

## 2015-08-01 DIAGNOSIS — Z111 Encounter for screening for respiratory tuberculosis: Secondary | ICD-10-CM | POA: Diagnosis not present

## 2015-08-01 NOTE — Progress Notes (Signed)
Patient came in for accompanied by daughter for  a PPD placement. 0.77ml Tuberculin was given left lower arm. Patient daughter advised that patient needs to be back on Friday afternoon to have PPD read. Rhonda Cunningham,CMA

## 2015-08-03 ENCOUNTER — Ambulatory Visit (INDEPENDENT_AMBULATORY_CARE_PROVIDER_SITE_OTHER): Payer: Medicare Other | Admitting: Family Medicine

## 2015-08-03 ENCOUNTER — Encounter: Payer: Self-pay | Admitting: Family Medicine

## 2015-08-03 DIAGNOSIS — Z7689 Persons encountering health services in other specified circumstances: Secondary | ICD-10-CM | POA: Diagnosis not present

## 2015-08-03 DIAGNOSIS — Z111 Encounter for screening for respiratory tuberculosis: Secondary | ICD-10-CM

## 2015-08-03 LAB — TB SKIN TEST
Induration: 0 mm
TB Skin Test: NEGATIVE

## 2015-08-03 NOTE — Progress Notes (Signed)
Patient was in office for PPD reading. There was no redness at the injection site. Mark Cooley,CMA

## 2015-08-06 ENCOUNTER — Telehealth: Payer: Self-pay | Admitting: Emergency Medicine

## 2015-08-06 NOTE — Addendum Note (Signed)
Addended by: Marcial Pacas on: 08/06/2015 01:46 PM   Modules accepted: Level of Service

## 2015-08-06 NOTE — Telephone Encounter (Signed)
Hassan Rowan from [facility]. Called on behalf of this patient; admitted to their facility today; they need list of current medications and a diet order; reportedly on regular diet. I faxed a list of his current meds per our records and gave verbal order for "regular diet as tolerated". pak

## 2015-08-07 NOTE — Telephone Encounter (Signed)
I approve as well.

## 2015-08-09 DIAGNOSIS — F411 Generalized anxiety disorder: Secondary | ICD-10-CM | POA: Diagnosis not present

## 2015-08-09 DIAGNOSIS — G301 Alzheimer's disease with late onset: Secondary | ICD-10-CM | POA: Diagnosis not present

## 2015-08-09 DIAGNOSIS — F0280 Dementia in other diseases classified elsewhere without behavioral disturbance: Secondary | ICD-10-CM | POA: Diagnosis not present

## 2015-08-09 IMAGING — CR DG CHEST 2V
2 series · 2 of 2 positions shown · non-contrast
Comparison: 02/16/2014 and 02/11/2014

CLINICAL DATA: Persistent productive cough for 3 weeks. Former
smoker. History of COPD and heart disease.

EXAM:
CHEST  2 VIEW

[view not recorded (1 of 2)]
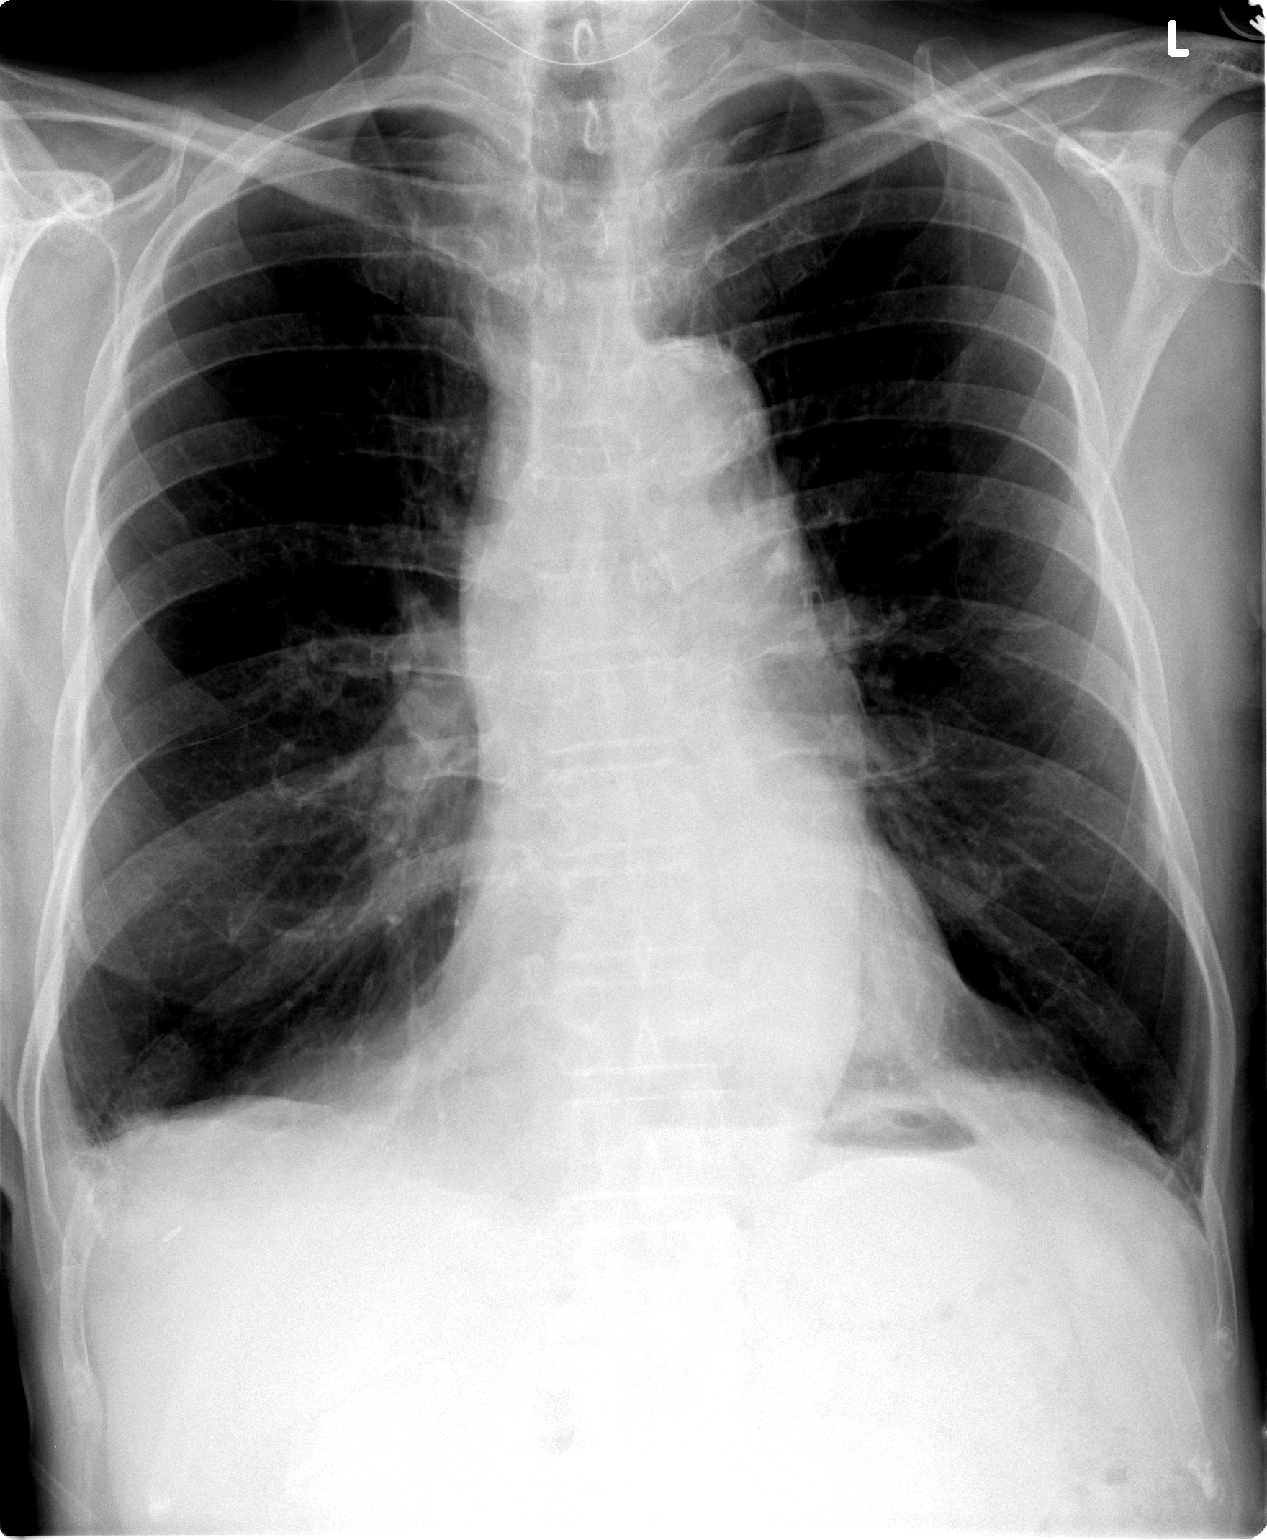

[view not recorded (2 of 2)]
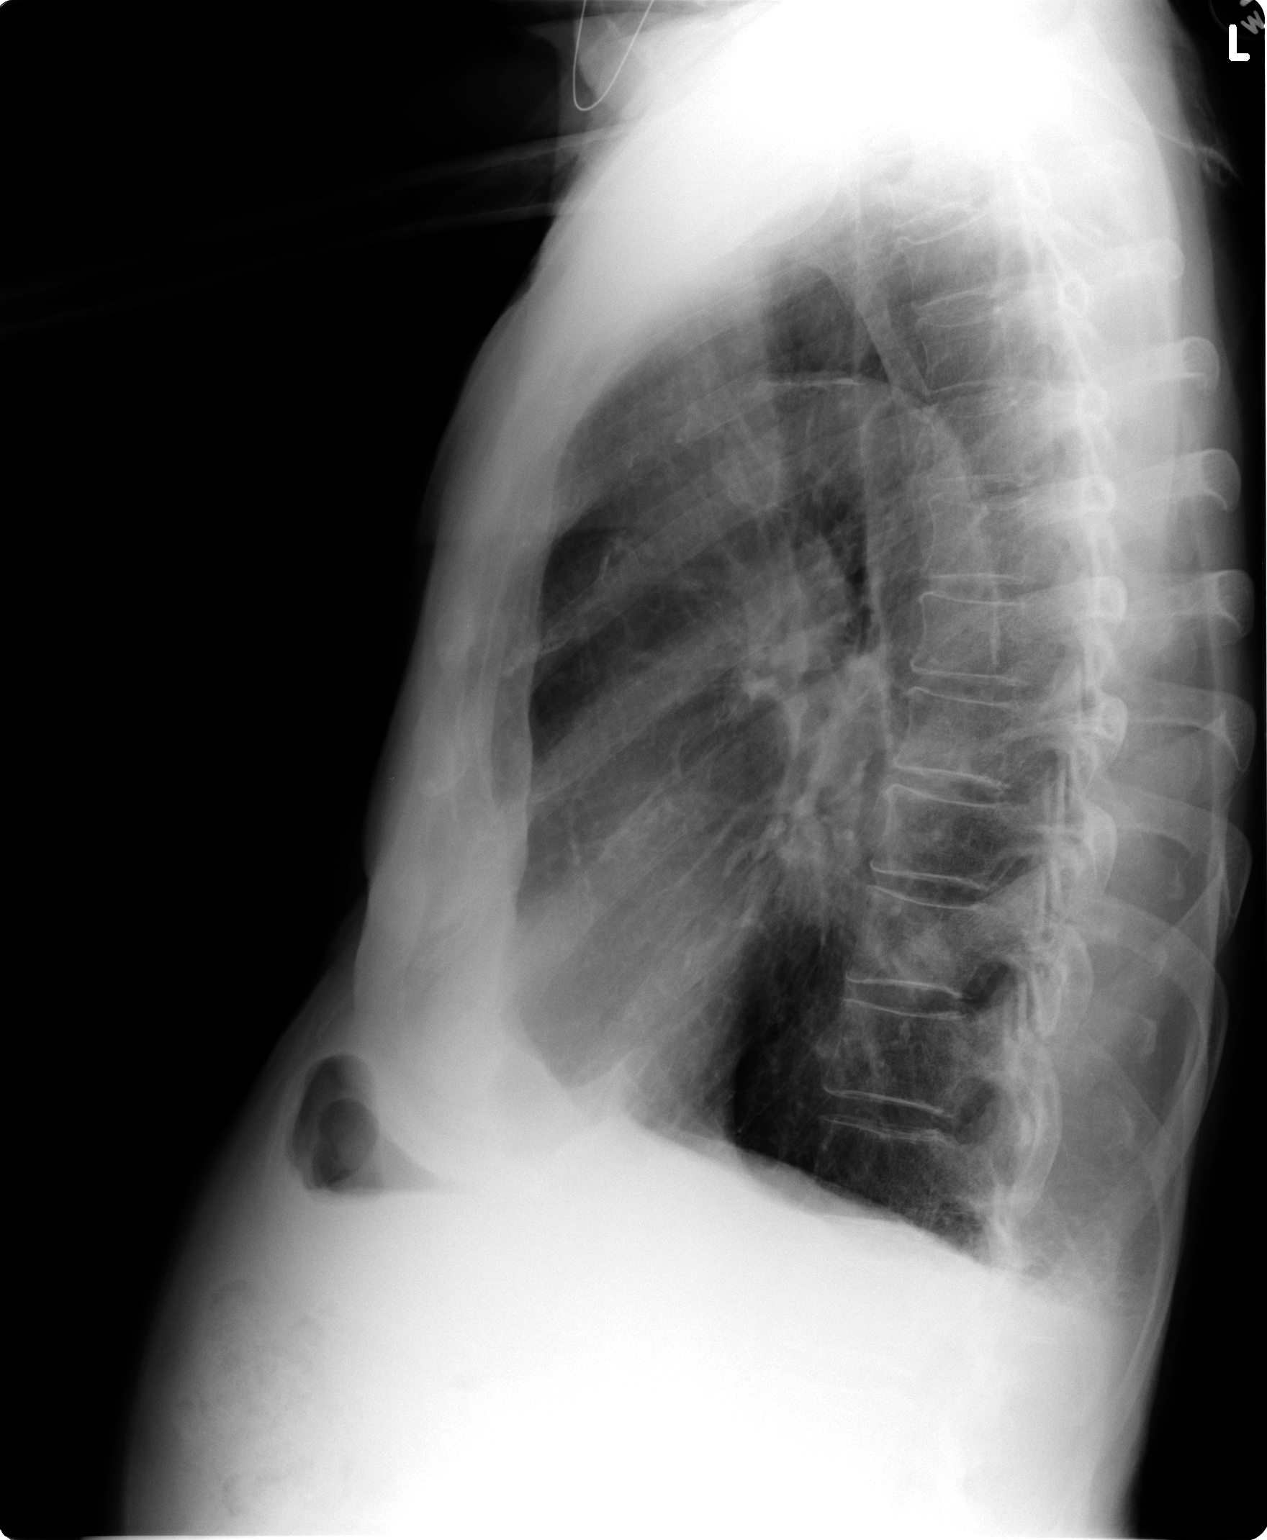

[2 of 2 positions shown; findings below may reference images not displayed]

FINDINGS: Cardiac silhouette is within normal limits. Mild prominence
calcification of the thoracic aorta is unchanged. Lungs remain
hyperinflated. Chronic blunting of the costophrenic angles is
unchanged without sizable pleural effusion identified. Minimal
densities in the lung bases likely represent scarring. There is no
evidence of acute airspace consolidation, edema, or pneumothorax. No
acute osseous abnormality is identified. Right upper quadrant
abdominal surgical clips are noted.
IMPRESSION: No active cardiopulmonary disease.

## 2015-08-15 ENCOUNTER — Telehealth: Payer: Self-pay | Admitting: Family Medicine

## 2015-08-15 ENCOUNTER — Telehealth: Payer: Self-pay

## 2015-08-15 DIAGNOSIS — G4700 Insomnia, unspecified: Secondary | ICD-10-CM

## 2015-08-15 DIAGNOSIS — J449 Chronic obstructive pulmonary disease, unspecified: Secondary | ICD-10-CM

## 2015-08-15 MED ORDER — TRAZODONE HCL 50 MG PO TABS: 75.0000 mg | ORAL_TABLET | Freq: Every evening | ORAL | Status: AC | PRN

## 2015-08-15 NOTE — Telephone Encounter (Signed)
Ivin Booty called requesting an order for a wheel for Mark Cooley.  She stated that he needs a better one.

## 2015-08-15 NOTE — Telephone Encounter (Signed)
Refill req 

## 2015-08-16 MED ORDER — AMBULATORY NON FORMULARY MEDICATION: Status: AC

## 2015-08-16 NOTE — Telephone Encounter (Signed)
Mark Cooley, Rx placed in in-box ready for pickup/faxing.  

## 2015-08-22 DIAGNOSIS — J449 Chronic obstructive pulmonary disease, unspecified: Secondary | ICD-10-CM | POA: Diagnosis not present

## 2015-08-28 ENCOUNTER — Other Ambulatory Visit: Payer: Self-pay | Admitting: Family Medicine

## 2015-08-29 DIAGNOSIS — L603 Nail dystrophy: Secondary | ICD-10-CM | POA: Diagnosis not present

## 2015-08-30 ENCOUNTER — Other Ambulatory Visit: Payer: Self-pay | Admitting: Family Medicine

## 2015-08-30 DIAGNOSIS — G4700 Insomnia, unspecified: Secondary | ICD-10-CM | POA: Diagnosis not present

## 2015-08-30 DIAGNOSIS — J449 Chronic obstructive pulmonary disease, unspecified: Secondary | ICD-10-CM | POA: Diagnosis not present

## 2015-08-30 DIAGNOSIS — J438 Other emphysema: Secondary | ICD-10-CM | POA: Diagnosis not present

## 2015-08-30 DIAGNOSIS — K5900 Constipation, unspecified: Secondary | ICD-10-CM | POA: Diagnosis not present

## 2015-09-17 ENCOUNTER — Other Ambulatory Visit: Payer: Self-pay | Admitting: Family Medicine

## 2015-09-19 DIAGNOSIS — J449 Chronic obstructive pulmonary disease, unspecified: Secondary | ICD-10-CM | POA: Diagnosis not present

## 2015-09-26 DIAGNOSIS — W06XXXA Fall from bed, initial encounter: Secondary | ICD-10-CM | POA: Diagnosis not present

## 2015-09-26 DIAGNOSIS — Z9981 Dependence on supplemental oxygen: Secondary | ICD-10-CM | POA: Diagnosis not present

## 2015-09-26 DIAGNOSIS — Z8679 Personal history of other diseases of the circulatory system: Secondary | ICD-10-CM | POA: Diagnosis not present

## 2015-09-26 DIAGNOSIS — Z7982 Long term (current) use of aspirin: Secondary | ICD-10-CM | POA: Diagnosis not present

## 2015-09-26 DIAGNOSIS — Z888 Allergy status to other drugs, medicaments and biological substances status: Secondary | ICD-10-CM | POA: Diagnosis not present

## 2015-09-26 DIAGNOSIS — E785 Hyperlipidemia, unspecified: Secondary | ICD-10-CM | POA: Diagnosis not present

## 2015-09-26 DIAGNOSIS — S41111A Laceration without foreign body of right upper arm, initial encounter: Secondary | ICD-10-CM | POA: Diagnosis not present

## 2015-09-26 DIAGNOSIS — Z7984 Long term (current) use of oral hypoglycemic drugs: Secondary | ICD-10-CM | POA: Diagnosis not present

## 2015-09-26 DIAGNOSIS — H579 Unspecified disorder of eye and adnexa: Secondary | ICD-10-CM | POA: Diagnosis not present

## 2015-09-26 DIAGNOSIS — I10 Essential (primary) hypertension: Secondary | ICD-10-CM | POA: Diagnosis not present

## 2015-09-26 DIAGNOSIS — G309 Alzheimer's disease, unspecified: Secondary | ICD-10-CM | POA: Diagnosis not present

## 2015-09-26 DIAGNOSIS — E119 Type 2 diabetes mellitus without complications: Secondary | ICD-10-CM | POA: Diagnosis not present

## 2015-09-26 DIAGNOSIS — S41101A Unspecified open wound of right upper arm, initial encounter: Secondary | ICD-10-CM | POA: Diagnosis not present

## 2015-09-26 DIAGNOSIS — Z87891 Personal history of nicotine dependence: Secondary | ICD-10-CM | POA: Diagnosis not present

## 2015-09-26 DIAGNOSIS — Z79899 Other long term (current) drug therapy: Secondary | ICD-10-CM | POA: Diagnosis not present

## 2015-09-27 ENCOUNTER — Other Ambulatory Visit: Payer: Self-pay | Admitting: Family Medicine

## 2015-09-27 DIAGNOSIS — J438 Other emphysema: Secondary | ICD-10-CM | POA: Diagnosis not present

## 2015-09-27 DIAGNOSIS — G301 Alzheimer's disease with late onset: Secondary | ICD-10-CM | POA: Diagnosis not present

## 2015-09-27 DIAGNOSIS — J069 Acute upper respiratory infection, unspecified: Secondary | ICD-10-CM | POA: Diagnosis not present

## 2015-09-27 DIAGNOSIS — R05 Cough: Secondary | ICD-10-CM | POA: Diagnosis not present

## 2015-09-27 DIAGNOSIS — E119 Type 2 diabetes mellitus without complications: Secondary | ICD-10-CM | POA: Diagnosis not present

## 2015-09-27 DIAGNOSIS — R918 Other nonspecific abnormal finding of lung field: Secondary | ICD-10-CM | POA: Diagnosis not present

## 2015-09-27 DIAGNOSIS — R296 Repeated falls: Secondary | ICD-10-CM | POA: Diagnosis not present

## 2015-10-06 DEATH — deceased

## 2016-08-04 IMAGING — CR DG HUMERUS 2V *L*
2 series · 2 of 2 positions shown · non-contrast
Comparison: None in PACs

CLINICAL DATA: Status post fall out of bed 2 weeks ago with
persistent left midshaft humeral pain

EXAM:
LEFT HUMERUS - 2+ VIEW

[humerus ap]
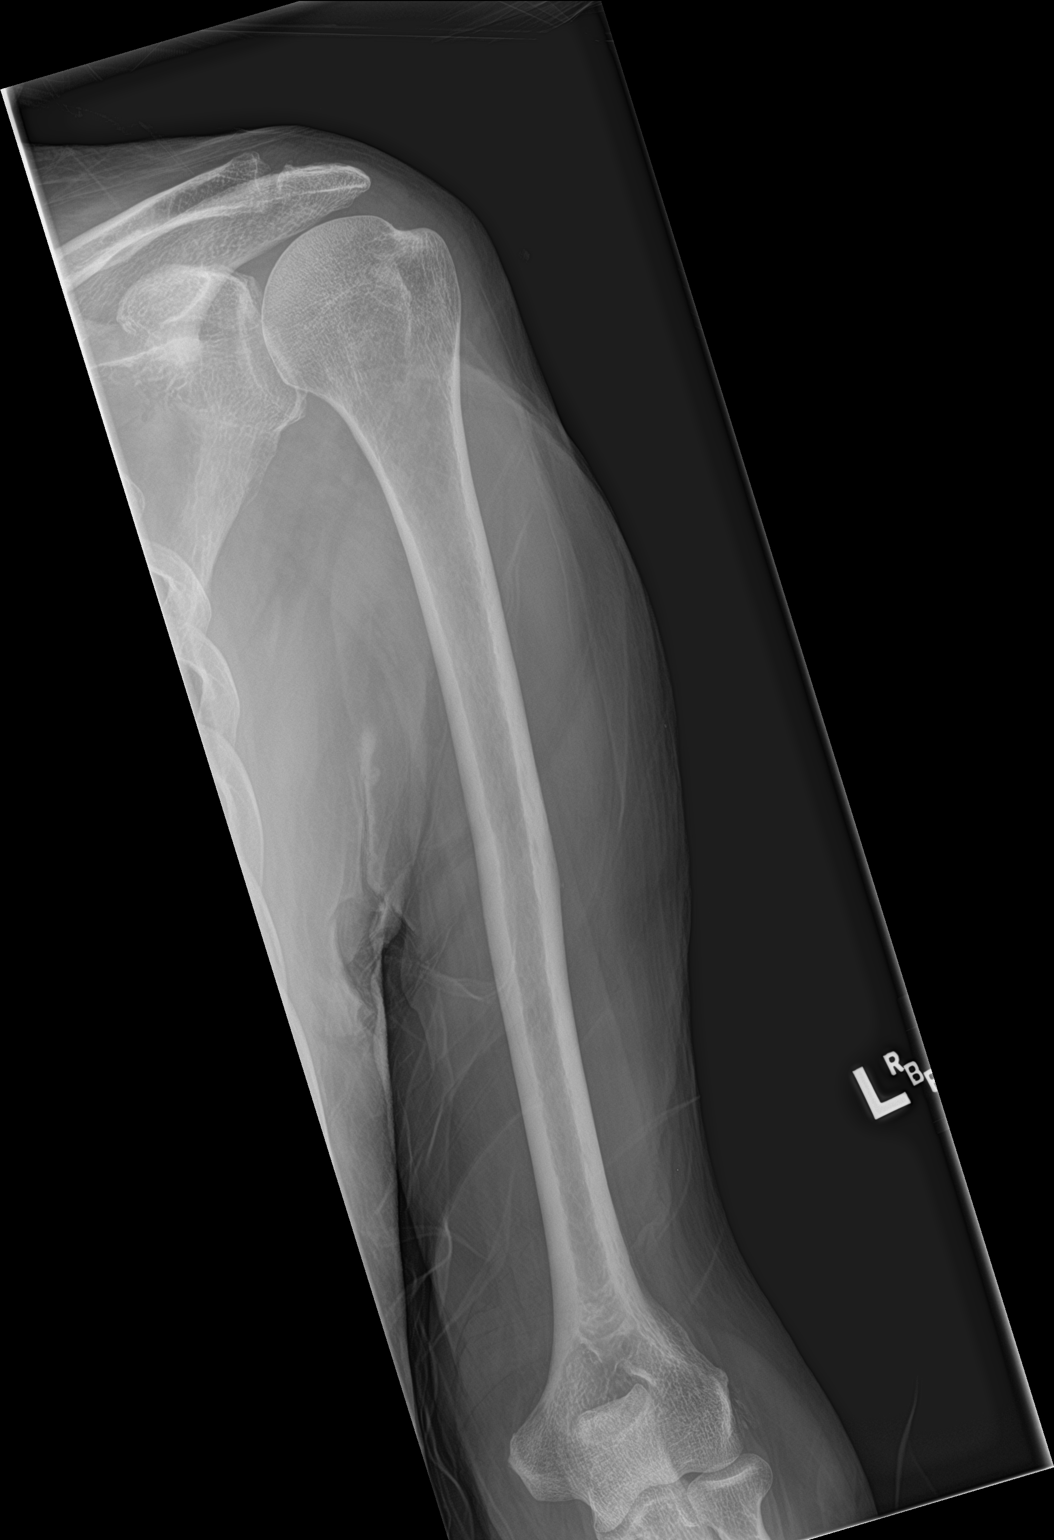

[humerus lat]
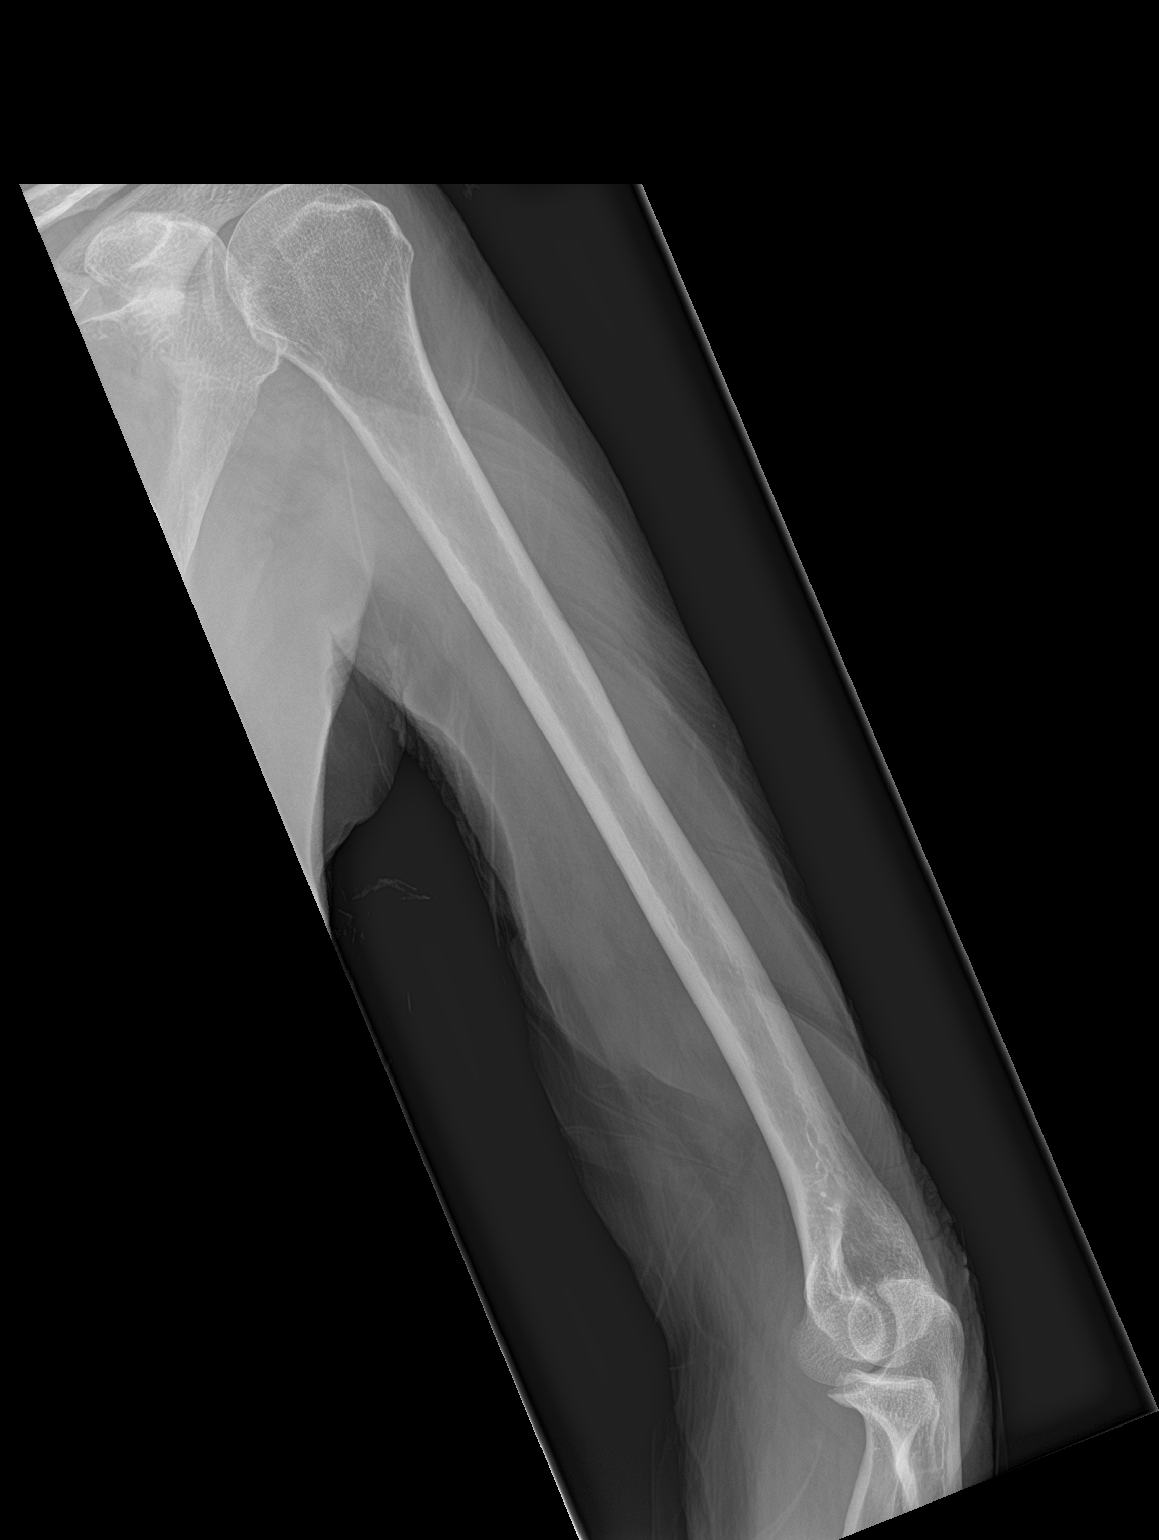

[2 of 2 positions shown; findings below may reference images not displayed]

FINDINGS: The left humerus is adequately mineralized. There is no acute
fracture. There is no periosteal reaction. The observed portions of
the left shoulder and left elbow exhibit no acute abnormalities. The
soft tissues of the arm are unremarkable.
IMPRESSION: There is no acute bony abnormality of the left shoulder.
